# Patient Record
Sex: Female | Born: 1950 | Race: White | Hispanic: No | Smoking: Former smoker
Health system: Southern US, Community
[De-identification: ages and names within clinical notes are randomized; demographics above are authoritative.]

## PROBLEM LIST (undated history)

## (undated) DIAGNOSIS — T7840XA Allergy, unspecified, initial encounter: Secondary | ICD-10-CM

## (undated) DIAGNOSIS — H6121 Impacted cerumen, right ear: Secondary | ICD-10-CM

## (undated) DIAGNOSIS — F411 Generalized anxiety disorder: Secondary | ICD-10-CM

## (undated) DIAGNOSIS — J45909 Unspecified asthma, uncomplicated: Secondary | ICD-10-CM

## (undated) DIAGNOSIS — I10 Essential (primary) hypertension: Secondary | ICD-10-CM

## (undated) HISTORY — DX: Essential (primary) hypertension: I10

## (undated) HISTORY — DX: Impacted cerumen, right ear: H61.21

## (undated) HISTORY — PX: TONSILLECTOMY: SUR1361

## (undated) HISTORY — DX: Allergy, unspecified, initial encounter: T78.40XA

## (undated) HISTORY — DX: Generalized anxiety disorder: F41.1

---

## 1998-07-09 HISTORY — PX: CHOLECYSTECTOMY: SHX55

## 1998-07-14 ENCOUNTER — Encounter: Payer: Self-pay | Admitting: Internal Medicine

## 1998-07-14 ENCOUNTER — Emergency Department (HOSPITAL_COMMUNITY): Admission: EM | Admit: 1998-07-14 | Discharge: 1998-07-14 | Payer: Self-pay | Admitting: Emergency Medicine

## 1998-07-14 ENCOUNTER — Encounter: Payer: Self-pay | Admitting: Emergency Medicine

## 2012-12-20 ENCOUNTER — Emergency Department (HOSPITAL_COMMUNITY): Payer: Self-pay

## 2012-12-20 ENCOUNTER — Encounter (HOSPITAL_COMMUNITY): Payer: Self-pay | Admitting: Emergency Medicine

## 2012-12-20 ENCOUNTER — Emergency Department (HOSPITAL_COMMUNITY)
Admission: EM | Admit: 2012-12-20 | Discharge: 2012-12-20 | Disposition: A | Discharge status: Home / Self Care | Payer: Self-pay | Attending: Emergency Medicine | Admitting: Emergency Medicine

## 2012-12-20 DIAGNOSIS — R112 Nausea with vomiting, unspecified: Secondary | ICD-10-CM | POA: Insufficient documentation

## 2012-12-20 DIAGNOSIS — F17200 Nicotine dependence, unspecified, uncomplicated: Secondary | ICD-10-CM | POA: Insufficient documentation

## 2012-12-20 DIAGNOSIS — J45909 Unspecified asthma, uncomplicated: Secondary | ICD-10-CM | POA: Insufficient documentation

## 2012-12-20 DIAGNOSIS — R059 Cough, unspecified: Secondary | ICD-10-CM | POA: Insufficient documentation

## 2012-12-20 DIAGNOSIS — R0602 Shortness of breath: Secondary | ICD-10-CM | POA: Insufficient documentation

## 2012-12-20 DIAGNOSIS — R05 Cough: Secondary | ICD-10-CM | POA: Insufficient documentation

## 2012-12-20 HISTORY — DX: Unspecified asthma, uncomplicated: J45.909

## 2012-12-20 LAB — URINALYSIS, ROUTINE W REFLEX MICROSCOPIC
Ketones, ur: NEGATIVE mg/dL
Nitrite: NEGATIVE
Protein, ur: NEGATIVE mg/dL
Urobilinogen, UA: 0.2 mg/dL (ref 0.0–1.0)
pH: 5.5 (ref 5.0–8.0)

## 2012-12-20 LAB — CBC WITH DIFFERENTIAL/PLATELET
Basophils Absolute: 0.1 10*3/uL (ref 0.0–0.1)
Basophils Relative: 1 % (ref 0–1)
Eosinophils Relative: 5 % (ref 0–5)
Lymphocytes Relative: 18 % (ref 12–46)
MCV: 84.5 fL (ref 78.0–100.0)
Neutro Abs: 8.5 10*3/uL — ABNORMAL HIGH (ref 1.7–7.7)
Platelets: 197 10*3/uL (ref 150–400)
RDW: 12.6 % (ref 11.5–15.5)
WBC: 12.2 10*3/uL — ABNORMAL HIGH (ref 4.0–10.5)

## 2012-12-20 LAB — URINE MICROSCOPIC-ADD ON

## 2012-12-20 MED ORDER — PREDNISONE 20 MG PO TABS: 40.0000 mg | ORAL_TABLET | Freq: Every day | ORAL | Status: DC

## 2012-12-20 MED ORDER — IPRATROPIUM BROMIDE 0.02 % IN SOLN
0.5000 mg | Freq: Once | RESPIRATORY_TRACT | Status: AC
  Administered 2012-12-20: 0.5 mg via RESPIRATORY_TRACT

## 2012-12-20 MED ORDER — PREDNISONE 20 MG PO TABS
60.0000 mg | ORAL_TABLET | Freq: Once | ORAL | Status: AC
  Administered 2012-12-20: 60 mg via ORAL
  Filled 2012-12-20: qty 3

## 2012-12-20 MED ORDER — ALBUTEROL SULFATE HFA 108 (90 BASE) MCG/ACT IN AERS: 1.0000 | INHALATION_SPRAY | Freq: Four times a day (QID) | RESPIRATORY_TRACT | Status: DC | PRN

## 2012-12-20 MED ORDER — HYDROCOD POLST-CHLORPHEN POLST 10-8 MG/5ML PO LQCR: 5.0000 mL | Freq: Every evening | ORAL | Status: DC | PRN

## 2012-12-20 MED ORDER — ALBUTEROL SULFATE (5 MG/ML) 0.5% IN NEBU
2.5000 mg | INHALATION_SOLUTION | Freq: Once | RESPIRATORY_TRACT | Status: AC
  Administered 2012-12-20: 2.5 mg via RESPIRATORY_TRACT

## 2012-12-20 NOTE — ED Provider Notes (Signed)
History     CSN: [id number]  Arrival date & time 12/20/12  1323   First MD Initiated Contact with Patient 12/20/12 1346      Chief Complaint  Patient presents with  . Shortness of Breath  . Cough  . Nausea    (Consider location/radiation/quality/duration/timing/severity/associated sxs/prior treatment) HPI Comments: Pt states that she has had cough with sob and decreased appetite, pt states that he is vomiting from coughing so much for the last 4 days:pt states that she has been using a friends inhaler for the last couple of days:pt denies fever:pt states that she doesn't have any medical problems but she also hasn't seen a doctor in 7 years:pt states that she is a smoker:pt states that she has also take benadryl without relief:pt states that has allergy problems like this every year but he is more tired with it this time  The history is provided by the patient. No language interpreter was used.    Past Medical History  Diagnosis Date  . Asthma     Past Surgical History  Procedure Laterality Date  . Cholecystectomy    . Tonsillectomy      No family history on file.  History  Substance Use Topics  . Smoking status: Current Every Day Smoker -- 0.50 packs/day    Types: Cigarettes  . Smokeless tobacco: Not on file  . Alcohol Use: No    OB History   Grav Para Term Preterm Abortions TAB SAB Ect Mult Living                  Review of Systems  Constitutional: Negative.   Respiratory: Positive for shortness of breath.   Cardiovascular: Negative.  Negative for chest pain.  Gastrointestinal: Positive for vomiting.    Allergies  Review of patient's allergies indicates no known allergies.  Home Medications  No current outpatient prescriptions on file.  BP 173/102  Pulse 104  Temp(Src) 98.5 F (36.9 C) (Oral)  SpO2 91%  Physical Exam  Nursing note and vitals reviewed. Constitutional: She is oriented to person, place, and time. She appears well-developed and  well-nourished.  HENT:  Head: Normocephalic and atraumatic.  Eyes: Conjunctivae and EOM are normal.  Neck: Neck supple.  Cardiovascular: Normal rate and regular rhythm.   Pulmonary/Chest:  Rales and wheezing in the right lower lobe  Abdominal: Soft. Bowel sounds are normal. There is no tenderness.  Musculoskeletal: Normal range of motion.  Neurological: She is alert and oriented to person, place, and time.  Skin: Skin is warm and dry.  Psychiatric: She has a normal mood and affect.    ED Course  Procedures (including critical care time)  Labs Reviewed  CBC WITH DIFFERENTIAL - Abnormal; Notable for the following:    WBC 12.2 (*)    RBC 5.29 (*)    Hemoglobin 16.3 (*)    MCHC 36.5 (*)    Neutro Abs 8.5 (*)    All other components within normal limits  URINALYSIS, ROUTINE W REFLEX MICROSCOPIC - Abnormal; Notable for the following:    Leukocytes, UA TRACE (*)    All other components within normal limits  D-DIMER, QUANTITATIVE  URINE MICROSCOPIC-ADD ON   Dg Chest 2 View  12/20/2012   *RADIOLOGY REPORT*  Clinical Data: Cough, shortness of breath  CHEST - 2 VIEW  Comparison: None.  Findings: Cardiomediastinal silhouette is unremarkable.  No acute infiltrate or pleural effusion.  No pulmonary edema.  Bony thorax is unremarkable.  IMPRESSION: No active disease.  Original Report Authenticated By: Natasha Mead, M.D.     1. SOB (shortness of breath)       MDM  Likely a copd exacerbation:pt refusing to be admitted:discussed with pt and pt is willing to follow up with her husbands doctor at [facility]:this is likely pts baseline:pt is instructed to return as needed:pt low risk for pe and d-dimer is negative        Teressa Lower, NP 12/20/12 1728

## 2012-12-20 NOTE — ED Notes (Signed)
Pt from home reports SOB, N/V, productive clear sputum with cough, difficulty eating x 4 days. Pt A&Ox4 and in NAD. Pt has hx of asthma has used albuterol with relief.

## 2012-12-20 NOTE — ED Notes (Addendum)
Patient reports that since October she has been under a lot of stress r/t her husband and that since this she has been feeling "awful," without being able to pinpoint a better description for her feeling. Patient reports that she hasn't been able to eat for a few days. Reports nausea without vomiting and coughing up a clear with a slight tan tint. Patient reports SOB starting a week ago which got better and then worsened. Patient reports that she came to the ED today because she was not getting any better.

## 2012-12-21 NOTE — ED Provider Notes (Signed)
Medical screening examination/treatment/procedure(s) were performed by non-physician practitioner and as supervising physician I was immediately available for consultation/collaboration.  Renia Mikelson T Starlet Gallentine, MD 12/21/12 1537 

## 2017-05-02 DIAGNOSIS — Z23 Encounter for immunization: Secondary | ICD-10-CM | POA: Diagnosis not present

## 2017-12-25 ENCOUNTER — Encounter: Payer: Self-pay | Admitting: Primary Care

## 2017-12-25 ENCOUNTER — Encounter (INDEPENDENT_AMBULATORY_CARE_PROVIDER_SITE_OTHER): Payer: Self-pay

## 2017-12-25 ENCOUNTER — Ambulatory Visit (INDEPENDENT_AMBULATORY_CARE_PROVIDER_SITE_OTHER): Payer: Medicare Other | Admitting: Primary Care

## 2017-12-25 VITALS — BP 194/110 | HR 91 | Temp 98.3°F | Ht 67.5 in | Wt 194.2 lb

## 2017-12-25 DIAGNOSIS — F411 Generalized anxiety disorder: Secondary | ICD-10-CM | POA: Diagnosis not present

## 2017-12-25 DIAGNOSIS — I10 Essential (primary) hypertension: Secondary | ICD-10-CM

## 2017-12-25 DIAGNOSIS — J45909 Unspecified asthma, uncomplicated: Secondary | ICD-10-CM | POA: Insufficient documentation

## 2017-12-25 DIAGNOSIS — J4520 Mild intermittent asthma, uncomplicated: Secondary | ICD-10-CM

## 2017-12-25 LAB — LIPID PANEL
CHOLESTEROL: 160 mg/dL (ref 0–200)
HDL: 58.3 mg/dL (ref >39.00)
LDL CALC: 89 mg/dL (ref 0–99)
NONHDL: 102
Total CHOL/HDL Ratio: 3
Triglycerides: 63 mg/dL (ref 0.0–149.0)
VLDL: 12.6 mg/dL (ref 0.0–40.0)

## 2017-12-25 LAB — COMPREHENSIVE METABOLIC PANEL
ALK PHOS: 95 U/L (ref 39–117)
ALT: 12 U/L (ref 0–35)
AST: 13 U/L (ref 0–37)
Albumin: 4.4 g/dL (ref 3.5–5.2)
BUN: 9 mg/dL (ref 6–23)
CHLORIDE: 97 meq/L (ref 96–112)
CO2: 31 mEq/L (ref 19–32)
Calcium: 10 mg/dL (ref 8.4–10.5)
Creatinine, Ser: 0.85 mg/dL (ref 0.40–1.20)
GFR: 70.97 mL/min (ref >60.00)
GLUCOSE: 120 mg/dL — AB (ref 70–99)
POTASSIUM: 5.2 meq/L — AB (ref 3.5–5.1)
SODIUM: 134 meq/L — AB (ref 135–145)
TOTAL PROTEIN: 8.2 g/dL (ref 6.0–8.3)
Total Bilirubin: 1 mg/dL (ref 0.2–1.2)

## 2017-12-25 MED ORDER — LISINOPRIL 20 MG PO TABS: ORAL_TABLET | ORAL | 0 refills | Status: DC

## 2017-12-25 NOTE — Assessment & Plan Note (Signed)
Long history of elevated readings, two documented elevated office readings as well as home readings. Rx for lisinopril 20 mg sent to pharmacy. Wouldn't start too high given level of BP readings.   Check lipids and CMP today.Will have her monitor BP at home, follow up in 3 weeks for re-evaluation and BMP.

## 2017-12-25 NOTE — Progress Notes (Signed)
Subjective:    Patient ID: Jennifer Cannon, female    DOB: 06/09/1951, 67 y.o.   MRN: [id number]  HPI  Jennifer Cannon is a 67 year old female who presents today to establish care and discuss the problems mentioned below. Will obtain old records.  1) Essential Hypertension: Long history of elevated readings when going to "doctor's office's". She's checking her BP at home infrequently and gets readings 160's/80's-90's. She has strong family history of hypertension in both parents and grandparents.   She denies chest pain, dizziness, headaches.   BP Readings from Last 3 Encounters:  12/25/17 (!) 200/120  12/20/12 166/88   2) GAD: Diagnosed several years ago when she lived in [place]. She lost her husband 5 months ago and has felt an increase in symptoms. Symptoms include feeling anxious, feeling nervous, palpitations, daily worry. GAD 7 score of 10 today. She doesn't feel like she needs medication for treatment, she does see a therapist through [facility].   3) Asthma: Mild, during seasonal changes. Using albuterol inhaler sparingly. Quit smoking Christmas Eve 2018.  Review of Systems  Constitutional: Negative for fatigue.  Eyes: Negative for visual disturbance.  Respiratory: Negative for shortness of breath and wheezing.   Cardiovascular: Negative for chest pain.  Allergic/Immunologic: Positive for environmental allergies.  Neurological: Negative for dizziness and weakness.  Psychiatric/Behavioral:       See HPI       Past Medical History:  Diagnosis Date  . Asthma   . Essential hypertension   . GAD (generalized anxiety disorder)      Social History   Socioeconomic History  . Marital status: Married    Spouse name: Not on file  . Number of children: Not on file  . Years of education: Not on file  . Highest education level: Not on file  Occupational History  . Not on file  Social Needs  . Financial resource strain: Not on file  . Food insecurity:    Worry: Not  on file    Inability: Not on file  . Transportation needs:    Medical: Not on file    Non-medical: Not on file  Tobacco Use  . Smoking status: Former Smoker    Packs/day: 0.50    Types: Cigarettes  . Smokeless tobacco: Never Used  Substance and Sexual Activity  . Alcohol use: No  . Drug use: No  . Sexual activity: Not Currently  Lifestyle  . Physical activity:    Days per week: Not on file    Minutes per session: Not on file  . Stress: Not on file  Relationships  . Social connections:    Talks on phone: Not on file    Gets together: Not on file    Attends religious service: Not on file    Active member of club or organization: Not on file    Attends meetings of clubs or organizations: Not on file    Relationship status: Not on file  . Intimate partner violence:    Fear of current or ex partner: Not on file    Emotionally abused: Not on file    Physically abused: Not on file    Forced sexual activity: Not on file  Other Topics Concern  . Not on file  Social History Narrative   Widowed.   Retired, once worked in Physiological scientistoffice administrator.   Enjoys crocheting, making cards.      Past Surgical History:  Procedure Laterality Date  . CHOLECYSTECTOMY  2000  .  TONSILLECTOMY      Family History  Problem Relation Age of Onset  . Hypertension Mother   . Diabetes Mother   . Hypertension Father   . Stroke Father   . COPD Sister   . Cancer Sister   . Hypertension Sister   . Hypertension Sister     No Known Allergies  Current Outpatient Medications on File Prior to Visit  Medication Sig Dispense Refill  . APPLE CIDER VINEGAR PO Take by mouth.    . diphenhydrAMINE (BENADRYL) 25 MG tablet Take 25 mg by mouth 2 (two) times daily.     . Magnesium 500 MG TABS Take by mouth.    . Melatonin 5 MG TABS Take by mouth.    . Multiple Vitamins-Minerals (CENTRUM SILVER 50+WOMEN) TABS Take by mouth.    Marland Kitchen albuterol (PROVENTIL HFA;VENTOLIN HFA) 108 (90 BASE) MCG/ACT inhaler Inhale 1-2  puffs into the lungs every 6 (six) hours as needed for wheezing. (Patient not taking: Reported on 12/25/2017) 1 Inhaler 0   No current facility-administered medications on file prior to visit.     BP (!) 194/110   Pulse 91   Temp 98.3 F (36.8 C) (Oral)   Ht 5' 7.5" (1.715 m)   Wt 194 lb 4 oz (88.1 kg)   SpO2 96%   BMI 29.97 kg/m    Objective:   Physical Exam  Constitutional: She appears well-nourished.  Neck: Neck supple.  Cardiovascular: Normal rate and regular rhythm.  Respiratory: Effort normal and breath sounds normal.  Skin: Skin is warm and dry.  Psychiatric: She has a normal mood and affect.           Assessment & Plan:

## 2017-12-25 NOTE — Assessment & Plan Note (Signed)
Long history of anxiety, increased since husbands passing. Denies depression. GAD 7 score of 10. Overall she's doing well with therapy. Discussed to notify if symptoms progressed. Consider Zoloft 25 mg in the future if needed.

## 2017-12-25 NOTE — Patient Instructions (Signed)
Stop by the lab prior to leaving today. I will notify you of your results once received.   Start lisinopril 20 mg tablets for high blood pressure. Take 1 tablet by mouth once daily.  Start monitoring your blood pressure daily, around the same time of day, for the next several weeks.  Ensure that you have rested for 30 minutes prior to checking your blood pressure. Record your readings and bring them to your next visit.  Schedule a follow up visit in 3 weeks for blood pressure check.  It was a pleasure to meet you today! Please don't hesitate to call or message me with any questions. Welcome to [facility]!    DASH Eating Plan DASH stands for "Dietary Approaches to Stop Hypertension." The DASH eating plan is a healthy eating plan that has been shown to reduce high blood pressure (hypertension). It may also reduce your risk for type 2 diabetes, heart disease, and stroke. The DASH eating plan may also help with weight loss. What are tips for following this plan? General guidelines  Avoid eating more than 2,300 mg (milligrams) of salt (sodium) a day. If you have hypertension, you may need to reduce your sodium intake to 1,500 mg a day.  Limit alcohol intake to no more than 1 drink a day for nonpregnant women and 2 drinks a day for men. One drink equals 12 oz of beer, 5 oz of wine, or 1 oz of hard liquor.  Work with your health care provider to maintain a healthy body weight or to lose weight. Ask what an ideal weight is for you.  Get at least 30 minutes of exercise that causes your heart to beat faster (aerobic exercise) most days of the week. Activities may include walking, swimming, or biking.  Work with your health care provider or diet and nutrition specialist (dietitian) to adjust your eating plan to your individual calorie needs. Reading food labels  Check food labels for the amount of sodium per serving. Choose foods with less than 5 percent of the Daily Value of sodium. Generally,  foods with less than 300 mg of sodium per serving fit into this eating plan.  To find whole grains, look for the word "whole" as the first word in the ingredient list. Shopping  Buy products labeled as "low-sodium" or "no salt added."  Buy fresh foods. Avoid canned foods and premade or frozen meals. Cooking  Avoid adding salt when cooking. Use salt-free seasonings or herbs instead of table salt or sea salt. Check with your health care provider or pharmacist before using salt substitutes.  Do not fry foods. Cook foods using healthy methods such as baking, boiling, grilling, and broiling instead.  Cook with heart-healthy oils, such as olive, canola, soybean, or sunflower oil. Meal planning   Eat a balanced diet that includes: ? 5 or more servings of fruits and vegetables each day. At each meal, try to fill half of your plate with fruits and vegetables. ? Up to 6-8 servings of whole grains each day. ? Less than 6 oz of lean meat, poultry, or fish each day. A 3-oz serving of meat is about the same size as a deck of cards. One egg equals 1 oz. ? 2 servings of low-fat dairy each day. ? A serving of nuts, seeds, or beans 5 times each week. ? Heart-healthy fats. Healthy fats called Omega-3 fatty acids are found in foods such as flaxseeds and coldwater fish, like sardines, salmon, and mackerel.  Limit how much you  eat of the following: ? Canned or prepackaged foods. ? Food that is high in trans fat, such as fried foods. ? Food that is high in saturated fat, such as fatty meat. ? Sweets, desserts, sugary drinks, and other foods with added sugar. ? Full-fat dairy products.  Do not salt foods before eating.  Try to eat at least 2 vegetarian meals each week.  Eat more home-cooked food and less restaurant, buffet, and fast food.  When eating at a restaurant, ask that your food be prepared with less salt or no salt, if possible. What foods are recommended? The items listed may not be a  complete list. Talk with your dietitian about what dietary choices are best for you. Grains Whole-grain or whole-wheat bread. Whole-grain or whole-wheat pasta. Brown rice. [place]row. Bulgur. Whole-grain and low-sodium cereals. Pita bread. Low-fat, low-sodium crackers. Whole-wheat flour tortillas. Vegetables Fresh or frozen vegetables (raw, steamed, roasted, or grilled). Low-sodium or reduced-sodium tomato and vegetable juice. Low-sodium or reduced-sodium tomato sauce and tomato paste. Low-sodium or reduced-sodium canned vegetables. Fruits All fresh, dried, or frozen fruit. Canned fruit in natural juice (without added sugar). Meat and other protein foods Skinless chicken or [place]. Ground chicken or [place]. Pork with fat trimmed off. Fish and seafood. Egg whites. Dried beans, peas, or lentils. Unsalted nuts, nut butters, and seeds. Unsalted canned beans. Lean cuts of beef with fat trimmed off. Low-sodium, lean deli meat. Dairy Low-fat (1%) or fat-free (skim) milk. Fat-free, low-fat, or reduced-fat cheeses. Nonfat, low-sodium ricotta or cottage cheese. Low-fat or nonfat yogurt. Low-fat, low-sodium cheese. Fats and oils Soft margarine without trans fats. Vegetable oil. Low-fat, reduced-fat, or light mayonnaise and salad dressings (reduced-sodium). Canola, safflower, olive, soybean, and sunflower oils. Avocado. Seasoning and other foods Herbs. Spices. Seasoning mixes without salt. Unsalted popcorn and pretzels. Fat-free sweets. What foods are not recommended? The items listed may not be a complete list. Talk with your dietitian about what dietary choices are best for you. Grains Baked goods made with fat, such as croissants, muffins, or some breads. Dry pasta or rice meal packs. Vegetables Creamed or fried vegetables. Vegetables in a cheese sauce. Regular canned vegetables (not low-sodium or reduced-sodium). Regular canned tomato sauce and paste (not low-sodium or reduced-sodium). Regular  tomato and vegetable juice (not low-sodium or reduced-sodium). Angie Fava. Olives. Fruits Canned fruit in a light or heavy syrup. Fried fruit. Fruit in cream or butter sauce. Meat and other protein foods Fatty cuts of meat. Ribs. Fried meat. Berniece Salines. Sausage. Bologna and other processed lunch meats. Salami. Fatback. Hotdogs. Bratwurst. Salted nuts and seeds. Canned beans with added salt. Canned or smoked fish. Whole eggs or egg yolks. Chicken or [place] with skin. Dairy Whole or 2% milk, cream, and half-and-half. Whole or full-fat cream cheese. Whole-fat or sweetened yogurt. Full-fat cheese. Nondairy creamers. Whipped toppings. Processed cheese and cheese spreads. Fats and oils Butter. Stick margarine. Lard. Shortening. Ghee. Bacon fat. Tropical oils, such as coconut, palm kernel, or palm oil. Seasoning and other foods Salted popcorn and pretzels. Onion salt, garlic salt, seasoned salt, table salt, and sea salt. Worcestershire sauce. Tartar sauce. Barbecue sauce. Teriyaki sauce. Soy sauce, including reduced-sodium. Steak sauce. Canned and packaged gravies. Fish sauce. Oyster sauce. Cocktail sauce. Horseradish that you find on the shelf. Ketchup. Mustard. Meat flavorings and tenderizers. Bouillon cubes. Hot sauce and Tabasco sauce. Premade or packaged marinades. Premade or packaged taco seasonings. Relishes. Regular salad dressings. Where to find more information:  National Heart, Lung, and Blood Institute: https://wilson-eaton.com/  American  Heart Association: www.heart.org Summary  The DASH eating plan is a healthy eating plan that has been shown to reduce high blood pressure (hypertension). It may also reduce your risk for type 2 diabetes, heart disease, and stroke.  With the DASH eating plan, you should limit salt (sodium) intake to 2,300 mg a day. If you have hypertension, you may need to reduce your sodium intake to 1,500 mg a day.  When on the DASH eating plan, aim to eat more fresh fruits and  vegetables, whole grains, lean proteins, low-fat dairy, and heart-healthy fats.  Work with your health care provider or diet and nutrition specialist (dietitian) to adjust your eating plan to your individual calorie needs. This information is not intended to replace advice given to you by your health care provider. Make sure you discuss any questions you have with your health care provider. Document Released: 06/14/2011 Document Revised: 06/18/2016 Document Reviewed: 06/18/2016 Elsevier Interactive Patient Education  [employer].

## 2017-12-25 NOTE — Assessment & Plan Note (Signed)
Intermittent during seasonal changes. Infrequent use of albuterol outside of seasonal changes. No wheezing on exam.

## 2017-12-26 ENCOUNTER — Ambulatory Visit: Payer: Self-pay | Admitting: Internal Medicine

## 2017-12-27 ENCOUNTER — Encounter: Payer: Self-pay | Admitting: Primary Care

## 2017-12-30 ENCOUNTER — Other Ambulatory Visit: Payer: Self-pay | Admitting: Primary Care

## 2017-12-30 ENCOUNTER — Other Ambulatory Visit (INDEPENDENT_AMBULATORY_CARE_PROVIDER_SITE_OTHER): Payer: Medicare Other

## 2017-12-30 DIAGNOSIS — R7303 Prediabetes: Secondary | ICD-10-CM

## 2017-12-30 DIAGNOSIS — I10 Essential (primary) hypertension: Secondary | ICD-10-CM

## 2017-12-30 DIAGNOSIS — Z0000 Encounter for general adult medical examination without abnormal findings: Secondary | ICD-10-CM | POA: Diagnosis not present

## 2017-12-30 LAB — COMPREHENSIVE METABOLIC PANEL
ALBUMIN: 4.4 g/dL (ref 3.5–5.2)
ALK PHOS: 86 U/L (ref 39–117)
ALT: 14 U/L (ref 0–35)
AST: 16 U/L (ref 0–37)
BILIRUBIN TOTAL: 0.5 mg/dL (ref 0.2–1.2)
BUN: 6 mg/dL (ref 6–23)
CO2: 31 mEq/L (ref 19–32)
CREATININE: 0.82 mg/dL (ref 0.40–1.20)
Calcium: 9.9 mg/dL (ref 8.4–10.5)
Chloride: 96 mEq/L (ref 96–112)
GFR: 73.97 mL/min (ref >60.00)
Glucose, Bld: 114 mg/dL — ABNORMAL HIGH (ref 70–99)
Potassium: 5.7 mEq/L — ABNORMAL HIGH (ref 3.5–5.1)
SODIUM: 133 meq/L — AB (ref 135–145)
Total Protein: 8 g/dL (ref 6.0–8.3)

## 2017-12-30 LAB — HEMOGLOBIN A1C: Hgb A1c MFr Bld: 6.1 % (ref 4.6–6.5)

## 2017-12-31 ENCOUNTER — Other Ambulatory Visit: Payer: Self-pay | Admitting: Primary Care

## 2017-12-31 DIAGNOSIS — I10 Essential (primary) hypertension: Secondary | ICD-10-CM

## 2017-12-31 MED ORDER — AMLODIPINE BESYLATE 10 MG PO TABS: ORAL_TABLET | ORAL | 0 refills | Status: DC

## 2018-01-20 ENCOUNTER — Encounter: Payer: Self-pay | Admitting: Primary Care

## 2018-01-20 ENCOUNTER — Ambulatory Visit (INDEPENDENT_AMBULATORY_CARE_PROVIDER_SITE_OTHER): Payer: Medicare Other | Admitting: Primary Care

## 2018-01-20 DIAGNOSIS — I10 Essential (primary) hypertension: Secondary | ICD-10-CM

## 2018-01-20 MED ORDER — HYDROCHLOROTHIAZIDE 25 MG PO TABS: ORAL_TABLET | ORAL | 0 refills | Status: DC

## 2018-01-20 MED ORDER — AMLODIPINE BESYLATE 10 MG PO TABS: ORAL_TABLET | ORAL | 3 refills | Status: DC

## 2018-01-20 NOTE — Patient Instructions (Signed)
Continue taking Amlodipine 10 mg tablets for blood pressure.   Start hydrochlorothiazide 25 mg tablets for blood pressure. Take 1 tablet by mouth once daily.  Continue to work on [employer].  Start exercising. You should be getting 150 minutes of moderate intensity exercise weekly.  Continue to monitor your blood pressure as discussed.  Schedule a follow up visit in 3 weeks for re-evaluation.  It was a pleasure to see you today!   DASH Eating Plan DASH stands for "Dietary Approaches to Stop Hypertension." The DASH eating plan is a healthy eating plan that has been shown to reduce high blood pressure (hypertension). It may also reduce your risk for type 2 diabetes, heart disease, and stroke. The DASH eating plan may also help with weight loss. What are tips for following this plan? General guidelines  Avoid eating more than 2,300 mg (milligrams) of salt (sodium) a day. If you have hypertension, you may need to reduce your sodium intake to 1,500 mg a day.  Limit alcohol intake to no more than 1 drink a day for nonpregnant women and 2 drinks a day for men. One drink equals 12 oz of beer, 5 oz of wine, or 1 oz of hard liquor.  Work with your health care provider to maintain a healthy body weight or to lose weight. Ask what an ideal weight is for you.  Get at least 30 minutes of exercise that causes your heart to beat faster (aerobic exercise) most days of the week. Activities may include walking, swimming, or biking.  Work with your health care provider or diet and nutrition specialist (dietitian) to adjust your eating plan to your individual calorie needs. Reading food labels  Check food labels for the amount of sodium per serving. Choose foods with less than 5 percent of the Daily Value of sodium. Generally, foods with less than 300 mg of sodium per serving fit into this eating plan.  To find whole grains, look for the word "whole" as the first word in the ingredient  list. Shopping  Buy products labeled as "low-sodium" or "no salt added."  Buy fresh foods. Avoid canned foods and premade or frozen meals. Cooking  Avoid adding salt when cooking. Use salt-free seasonings or herbs instead of table salt or sea salt. Check with your health care provider or pharmacist before using salt substitutes.  Do not fry foods. Cook foods using healthy methods such as baking, boiling, grilling, and broiling instead.  Cook with heart-healthy oils, such as olive, canola, soybean, or sunflower oil. Meal planning   Eat a balanced diet that includes: ? 5 or more servings of fruits and vegetables each day. At each meal, try to fill half of your plate with fruits and vegetables. ? Up to 6-8 servings of whole grains each day. ? Less than 6 oz of lean meat, poultry, or fish each day. A 3-oz serving of meat is about the same size as a deck of cards. One egg equals 1 oz. ? 2 servings of low-fat dairy each day. ? A serving of nuts, seeds, or beans 5 times each week. ? Heart-healthy fats. Healthy fats called Omega-3 fatty acids are found in foods such as flaxseeds and coldwater fish, like sardines, salmon, and mackerel.  Limit how much you eat of the following: ? Canned or prepackaged foods. ? Food that is high in trans fat, such as fried foods. ? Food that is high in saturated fat, such as fatty meat. ? Sweets, desserts, sugary drinks, and  other foods with added sugar. ? Full-fat dairy products.  Do not salt foods before eating.  Try to eat at least 2 vegetarian meals each week.  Eat more home-cooked food and less restaurant, buffet, and fast food.  When eating at a restaurant, ask that your food be prepared with less salt or no salt, if possible. What foods are recommended? The items listed may not be a complete list. Talk with your dietitian about what dietary choices are best for you. Grains Whole-grain or whole-wheat bread. Whole-grain or whole-wheat pasta. Brown  rice. [place]. Bulgur. Whole-grain and low-sodium cereals. Pita bread. Low-fat, low-sodium crackers. Whole-wheat flour tortillas. Vegetables Fresh or frozen vegetables (raw, steamed, roasted, or grilled). Low-sodium or reduced-sodium tomato and vegetable juice. Low-sodium or reduced-sodium tomato sauce and tomato paste. Low-sodium or reduced-sodium canned vegetables. Fruits All fresh, dried, or frozen fruit. Canned fruit in natural juice (without added sugar). Meat and other protein foods Skinless chicken or [place]. Ground chicken or [place]. Pork with fat trimmed off. Fish and seafood. Egg whites. Dried beans, peas, or lentils. Unsalted nuts, nut butters, and seeds. Unsalted canned beans. Lean cuts of beef with fat trimmed off. Low-sodium, lean deli meat. Dairy Low-fat (1%) or fat-free (skim) milk. Fat-free, low-fat, or reduced-fat cheeses. Nonfat, low-sodium ricotta or cottage cheese. Low-fat or nonfat yogurt. Low-fat, low-sodium cheese. Fats and oils Soft margarine without trans fats. Vegetable oil. Low-fat, reduced-fat, or light mayonnaise and salad dressings (reduced-sodium). Canola, safflower, olive, soybean, and sunflower oils. Avocado. Seasoning and other foods Herbs. Spices. Seasoning mixes without salt. Unsalted popcorn and pretzels. Fat-free sweets. What foods are not recommended? The items listed may not be a complete list. Talk with your dietitian about what dietary choices are best for you. Grains Baked goods made with fat, such as croissants, muffins, or some breads. Dry pasta or rice meal packs. Vegetables Creamed or fried vegetables. Vegetables in a cheese sauce. Regular canned vegetables (not low-sodium or reduced-sodium). Regular canned tomato sauce and paste (not low-sodium or reduced-sodium). Regular tomato and vegetable juice (not low-sodium or reduced-sodium). Angie Fava. Olives. Fruits Canned fruit in a light or heavy syrup. Fried fruit. Fruit in cream or butter  sauce. Meat and other protein foods Fatty cuts of meat. Ribs. Fried meat. Berniece Salines. Sausage. Bologna and other processed lunch meats. Salami. Fatback. Hotdogs. Bratwurst. Salted nuts and seeds. Canned beans with added salt. Canned or smoked fish. Whole eggs or egg yolks. Chicken or [place] with skin. Dairy Whole or 2% milk, cream, and half-and-half. Whole or full-fat cream cheese. Whole-fat or sweetened yogurt. Full-fat cheese. Nondairy creamers. Whipped toppings. Processed cheese and cheese spreads. Fats and oils Butter. Stick margarine. Lard. Shortening. Ghee. Bacon fat. Tropical oils, such as coconut, palm kernel, or palm oil. Seasoning and other foods Salted popcorn and pretzels. Onion salt, garlic salt, seasoned salt, table salt, and sea salt. Worcestershire sauce. Tartar sauce. Barbecue sauce. Teriyaki sauce. Soy sauce, including reduced-sodium. Steak sauce. Canned and packaged gravies. Fish sauce. Oyster sauce. Cocktail sauce. Horseradish that you find on the shelf. Ketchup. Mustard. Meat flavorings and tenderizers. Bouillon cubes. Hot sauce and Tabasco sauce. Premade or packaged marinades. Premade or packaged taco seasonings. Relishes. Regular salad dressings. Where to find more information:  National Heart, Lung, and [place]: https://wilson-eaton.com/  American Heart Association: www.heart.org Summary  The DASH eating plan is a healthy eating plan that has been shown to reduce high blood pressure (hypertension). It may also reduce your risk for type 2 diabetes, heart disease, and stroke.  With the DASH eating plan, you should limit salt (sodium) intake to 2,300 mg a day. If you have hypertension, you may need to reduce your sodium intake to 1,500 mg a day.  When on the DASH eating plan, aim to eat more fresh fruits and vegetables, whole grains, lean proteins, low-fat dairy, and heart-healthy fats.  Work with your health care provider or diet and nutrition specialist (dietitian) to adjust  your eating plan to your individual calorie needs. This information is not intended to replace advice given to you by your health care provider. Make sure you discuss any questions you have with your health care provider. Document Released: 06/14/2011 Document Revised: 06/18/2016 Document Reviewed: 06/18/2016 Elsevier Interactive Patient Education  [employer]2018 Elsevier Inc.

## 2018-01-20 NOTE — Assessment & Plan Note (Signed)
Improved on Amlodipine 10 mg, still above goal. Lisinopril caused cough which has since resolved. Continue Amlodipine, add on HCTZ 25 mg today.  Will have her continue to monitor BP and follow up in 3 weeks for re-evaluation and BMP.

## 2018-01-20 NOTE — Progress Notes (Signed)
Subjective:    Patient ID: Jennifer Cannon, female    DOB: Nov 13, 1950, 67 y.o.   MRN: [id number]  HPI  Jennifer Cannon is a 67 year old female who presents today for follow up of hypertension.  She was last evaluated in mid June 2019 with a long history of untreated hypertension with a reading of 200/120 in the clinic. She was treated with Lisinopril which caused a horrible cough so she was switched to Amlodipine 10 mg tablets.   BP Readings from Last 3 Encounters:  01/20/18 (!) 160/100  12/25/17 (!) 194/110  12/20/12 166/88   Since her last visit she's feeling better. She denies cough since coming off of Lisinopril. She's checked her BP at few times which are running 140-150/80's. She denies chest pain, dizziness, shortness of breath.   Review of Systems  Eyes: Negative for visual disturbance.  Respiratory: Negative for cough and shortness of breath.   Cardiovascular: Negative for chest pain and leg swelling.  Neurological: Negative for dizziness and headaches.       Past Medical History:  Diagnosis Date  . Asthma   . Essential hypertension   . GAD (generalized anxiety disorder)      Social History   Socioeconomic History  . Marital status: Widowed    Spouse name: Not on file  . Number of children: Not on file  . Years of education: Not on file  . Highest education level: Not on file  Occupational History  . Not on file  Social Needs  . Financial resource strain: Not on file  . Food insecurity:    Worry: Not on file    Inability: Not on file  . Transportation needs:    Medical: Not on file    Non-medical: Not on file  Tobacco Use  . Smoking status: Former Smoker    Packs/day: 0.50    Types: Cigarettes  . Smokeless tobacco: Never Used  Substance and Sexual Activity  . Alcohol use: No  . Drug use: No  . Sexual activity: Not Currently  Lifestyle  . Physical activity:    Days per week: Not on file    Minutes per session: Not on file  . Stress: Not on file    Relationships  . Social connections:    Talks on phone: Not on file    Gets together: Not on file    Attends religious service: Not on file    Active member of club or organization: Not on file    Attends meetings of clubs or organizations: Not on file    Relationship status: Not on file  . Intimate partner violence:    Fear of current or ex partner: Not on file    Emotionally abused: Not on file    Physically abused: Not on file    Forced sexual activity: Not on file  Other Topics Concern  . Not on file  Social History Narrative   Widowed.   Retired, once worked in Physiological scientist.   Enjoys crocheting, making cards.      Past Surgical History:  Procedure Laterality Date  . CHOLECYSTECTOMY  2000  . TONSILLECTOMY      Family History  Problem Relation Age of Onset  . Hypertension Mother   . Diabetes Mother   . Hypertension Father   . Stroke Father   . COPD Sister   . Cancer Sister   . Hypertension Sister   . Hypertension Sister     Allergies  Allergen Reactions  .  Lisinopril     Current Outpatient Medications on File Prior to Visit  Medication Sig Dispense Refill  . APPLE CIDER VINEGAR PO Take by mouth.    . diphenhydrAMINE (BENADRYL) 25 MG tablet Take 25 mg by mouth 2 (two) times daily.     . Melatonin 5 MG TABS Take by mouth.    . Multiple Vitamins-Minerals (CENTRUM SILVER 50+WOMEN) TABS Take by mouth.    Marland Kitchen. albuterol (PROVENTIL HFA;VENTOLIN HFA) 108 (90 BASE) MCG/ACT inhaler Inhale 1-2 puffs into the lungs every 6 (six) hours as needed for wheezing. (Patient not taking: Reported on 12/25/2017) 1 Inhaler 0   No current facility-administered medications on file prior to visit.     BP (!) 160/100   Pulse 89   Temp 97.9 F (36.6 C) (Oral)   Ht 5' 7.75" (1.721 m)   Wt 195 lb 8 oz (88.7 kg)   SpO2 97%   BMI 29.95 kg/m    Objective:   Physical Exam  Constitutional: She appears well-nourished.  Neck: Neck supple.  Cardiovascular: Normal rate and  regular rhythm.  Respiratory: Effort normal and breath sounds normal.  Skin: Skin is warm and dry.           Assessment & Plan:

## 2018-01-21 ENCOUNTER — Other Ambulatory Visit: Payer: Self-pay | Admitting: Primary Care

## 2018-01-21 DIAGNOSIS — I10 Essential (primary) hypertension: Secondary | ICD-10-CM

## 2018-02-11 ENCOUNTER — Encounter: Payer: Self-pay | Admitting: Primary Care

## 2018-02-11 ENCOUNTER — Other Ambulatory Visit: Payer: Self-pay | Admitting: Primary Care

## 2018-02-11 ENCOUNTER — Ambulatory Visit (INDEPENDENT_AMBULATORY_CARE_PROVIDER_SITE_OTHER): Payer: Medicare Other | Admitting: Primary Care

## 2018-02-11 DIAGNOSIS — I10 Essential (primary) hypertension: Secondary | ICD-10-CM

## 2018-02-11 LAB — BASIC METABOLIC PANEL
BUN: 7 mg/dL (ref 6–23)
CO2: 33 mEq/L — ABNORMAL HIGH (ref 19–32)
CREATININE: 0.83 mg/dL (ref 0.40–1.20)
Calcium: 10.4 mg/dL (ref 8.4–10.5)
Chloride: 88 mEq/L — ABNORMAL LOW (ref 96–112)
GFR: 72.92 mL/min (ref >60.00)
Glucose, Bld: 123 mg/dL — ABNORMAL HIGH (ref 70–99)
POTASSIUM: 4.2 meq/L (ref 3.5–5.1)
Sodium: 130 mEq/L — ABNORMAL LOW (ref 135–145)

## 2018-02-11 MED ORDER — HYDROCHLOROTHIAZIDE 25 MG PO TABS: ORAL_TABLET | ORAL | 1 refills | Status: DC

## 2018-02-11 NOTE — Patient Instructions (Signed)
Stop by the lab prior to leaving today. I will notify you of your results once received.   Continue taking hydrochlorothiazide 25 mg and Amlodipine 10 mg for blood pressure.  It was a pleasure to see you today!

## 2018-02-11 NOTE — Progress Notes (Signed)
Subjective:    Patient ID: Jennifer Cannon, female    DOB: 1951/03/24, 67 y.o.   MRN: [id number]  HPI  Jennifer Cannon is a 67 year old female who presents today for follow up of hypertension.  She is currently managed on HCTZ 25 mg and Amlodipine 10 mg. She was last evaluated 2 weeks ago, HCTZ 25 mg was added to regimen. Lisinopril was discontinued several weeks prior due to side effect of cough.   BP Readings from Last 3 Encounters:  02/11/18 140/84  01/20/18 (!) 160/100  12/25/17 (!) 194/110    Since her last visit she's checking her BP at home. Her readings are ranging 117-130's/70-80's. Overall she's feeling well, she's recently started exercising and is working on her diet. She denies headaches, chest pain, dizziness.   Review of Systems  Eyes: Negative for visual disturbance.  Respiratory: Negative for shortness of breath.   Cardiovascular: Negative for chest pain.  Neurological: Negative for dizziness and headaches.       Past Medical History:  Diagnosis Date  . Asthma   . Essential hypertension   . GAD (generalized anxiety disorder)      Social History   Socioeconomic History  . Marital status: Widowed    Spouse name: Not on file  . Number of children: Not on file  . Years of education: Not on file  . Highest education level: Not on file  Occupational History  . Not on file  Social Needs  . Financial resource strain: Not on file  . Food insecurity:    Worry: Not on file    Inability: Not on file  . Transportation needs:    Medical: Not on file    Non-medical: Not on file  Tobacco Use  . Smoking status: Former Smoker    Packs/day: 0.50    Types: Cigarettes  . Smokeless tobacco: Never Used  Substance and Sexual Activity  . Alcohol use: No  . Drug use: No  . Sexual activity: Not Currently  Lifestyle  . Physical activity:    Days per week: Not on file    Minutes per session: Not on file  . Stress: Not on file  Relationships  . Social connections:    Talks on phone: Not on file    Gets together: Not on file    Attends religious service: Not on file    Active member of club or organization: Not on file    Attends meetings of clubs or organizations: Not on file    Relationship status: Not on file  . Intimate partner violence:    Fear of current or ex partner: Not on file    Emotionally abused: Not on file    Physically abused: Not on file    Forced sexual activity: Not on file  Other Topics Concern  . Not on file  Social History Narrative   Widowed.   Retired, once worked in Physiological scientist.   Enjoys crocheting, making cards.      Past Surgical History:  Procedure Laterality Date  . CHOLECYSTECTOMY  2000  . TONSILLECTOMY      Family History  Problem Relation Age of Onset  . Hypertension Mother   . Diabetes Mother   . Hypertension Father   . Stroke Father   . COPD Sister   . Cancer Sister   . Hypertension Sister   . Hypertension Sister     Allergies  Allergen Reactions  . Lisinopril     Current Outpatient  Medications on File Prior to Visit  Medication Sig Dispense Refill  . amLODipine (NORVASC) 10 MG tablet Take 1 tablet by mouth once daily for blood pressure. 90 tablet 3  . APPLE CIDER VINEGAR PO Take by mouth.    . diphenhydrAMINE (BENADRYL) 25 MG tablet Take 25 mg by mouth 2 (two) times daily.     . Melatonin 5 MG TABS Take by mouth.    . Multiple Vitamins-Minerals (CENTRUM SILVER 50+WOMEN) TABS Take by mouth.    Marland Kitchen. albuterol (PROVENTIL HFA;VENTOLIN HFA) 108 (90 BASE) MCG/ACT inhaler Inhale 1-2 puffs into the lungs every 6 (six) hours as needed for wheezing. (Patient not taking: Reported on 12/25/2017) 1 Inhaler 0   No current facility-administered medications on file prior to visit.     BP 140/84   Pulse 79   Temp 97.9 F (36.6 C) (Oral)   Ht 5' 7.75" (1.721 m)   Wt 195 lb 12 oz (88.8 kg)   SpO2 97%   BMI 29.98 kg/m    Objective:   Physical Exam  Constitutional: She appears well-nourished.    Neck: Neck supple.  Cardiovascular: Normal rate and regular rhythm.  Respiratory: Effort normal and breath sounds normal.  Skin: Skin is warm and dry.           Assessment & Plan:

## 2018-02-11 NOTE — Assessment & Plan Note (Signed)
Improved on HCTZ 25 mg an Amlodipine 10 mg. Will continue same given that she is stable. Home readings are better. BMP pending today.

## 2018-02-16 ENCOUNTER — Other Ambulatory Visit: Payer: Self-pay | Admitting: Primary Care

## 2018-02-16 DIAGNOSIS — I10 Essential (primary) hypertension: Secondary | ICD-10-CM

## 2018-03-12 ENCOUNTER — Other Ambulatory Visit (INDEPENDENT_AMBULATORY_CARE_PROVIDER_SITE_OTHER): Payer: Medicare Other

## 2018-03-12 DIAGNOSIS — I10 Essential (primary) hypertension: Secondary | ICD-10-CM | POA: Diagnosis not present

## 2018-03-12 LAB — BASIC METABOLIC PANEL
BUN: 6 mg/dL (ref 6–23)
CHLORIDE: 92 meq/L — AB (ref 96–112)
CO2: 31 mEq/L (ref 19–32)
Calcium: 9.9 mg/dL (ref 8.4–10.5)
Creatinine, Ser: 0.84 mg/dL (ref 0.40–1.20)
GFR: 71.9 mL/min (ref >60.00)
Glucose, Bld: 113 mg/dL — ABNORMAL HIGH (ref 70–99)
POTASSIUM: 4 meq/L (ref 3.5–5.1)
Sodium: 131 mEq/L — ABNORMAL LOW (ref 135–145)

## 2018-03-13 ENCOUNTER — Encounter: Payer: Self-pay | Admitting: Family Medicine

## 2018-03-13 ENCOUNTER — Ambulatory Visit (INDEPENDENT_AMBULATORY_CARE_PROVIDER_SITE_OTHER): Payer: Medicare Other | Admitting: Family Medicine

## 2018-03-13 DIAGNOSIS — H6121 Impacted cerumen, right ear: Secondary | ICD-10-CM | POA: Diagnosis not present

## 2018-03-13 DIAGNOSIS — I10 Essential (primary) hypertension: Secondary | ICD-10-CM | POA: Diagnosis not present

## 2018-03-13 HISTORY — DX: Impacted cerumen, right ear: H61.21

## 2018-03-13 NOTE — Progress Notes (Signed)
   Subjective:    Patient ID: Jennifer Cannon, female    DOB: Jul 08, 1951, 67 y.o.   MRN: [id number]  Otalgia   There is pain in the right ear. This is a new problem. The current episode started in the past 7 days (noted in last 4 days after using a Q tip.). The problem has been unchanged. There has been no fever. The pain is at a severity of 2/10. Pertinent negatives include no coughing, diarrhea, headaches, neck pain, rash, rhinorrhea, sore throat or vomiting. Treatments tried: has tried peroxide. The treatment provided no relief. There is no history of a chronic ear infection, hearing loss or a tympanostomy tube.    BP improved on amlodipine  New start..  Stopped HCTZ  2 days ago given dry mouth and didn't like frequent urination. BP Readings from Last 3 Encounters:  03/13/18 (!) 148/72  02/11/18 140/84  01/20/18 (!) 160/100     Blood pressure (!) 148/72, pulse 94, temperature 97.7 F (36.5 C), temperature source Oral, height 5' 7.75" (1.721 m), weight 198 lb 4 oz (89.9 kg), SpO2 97 %.   Review of Systems  HENT: Positive for ear pain. Negative for rhinorrhea and sore throat.   Respiratory: Negative for cough.   Gastrointestinal: Negative for diarrhea and vomiting.  Musculoskeletal: Negative for neck pain.  Skin: Negative for rash.  Neurological: Negative for headaches.       Objective:   Physical Exam  Constitutional: Vital signs are normal. She appears well-developed and well-nourished. She is cooperative.  Non-toxic appearance. She does not appear ill. No distress.  HENT:  Head: Normocephalic.  Right Ear: Hearing, tympanic membrane, external ear and ear canal normal. Tympanic membrane is not erythematous, not retracted and not bulging.  Left Ear: Hearing, tympanic membrane, external ear and ear canal normal. Tympanic membrane is not erythematous, not retracted and not bulging.  Nose: No mucosal edema or rhinorrhea. Right sinus exhibits no maxillary sinus tenderness and no  frontal sinus tenderness. Left sinus exhibits no maxillary sinus tenderness and no frontal sinus tenderness.  Mouth/Throat: Uvula is midline, oropharynx is clear and moist and mucous membranes are normal.   Right ear cerumen impaction cleared with irrigation  Eyes: Pupils are equal, round, and reactive to light. Conjunctivae, EOM and lids are normal. Lids are everted and swept, no foreign bodies found.  Neck: Trachea normal and normal range of motion. Neck supple. Carotid bruit is not present. No thyroid mass and no thyromegaly present.  Cardiovascular: Normal rate, regular rhythm, S1 normal, S2 normal, normal heart sounds, intact distal pulses and normal pulses. Exam reveals no gallop and no friction rub.  No murmur heard. Pulmonary/Chest: Effort normal and breath sounds normal. No tachypnea. No respiratory distress. She has no decreased breath sounds. She has no wheezes. She has no rhonchi. She has no rales.  Abdominal: Soft. Normal appearance and bowel sounds are normal. There is no tenderness.  Neurological: She is alert.  Skin: Skin is warm, dry and intact. No rash noted.  Psychiatric: Her speech is normal and behavior is normal. Judgment and thought content normal. Her mood appears not anxious. Cognition and memory are normal. She does not exhibit a depressed mood.          Assessment & Plan:

## 2018-03-13 NOTE — Assessment & Plan Note (Signed)
Irrigation performed without issue and prevention reviewed.

## 2018-03-13 NOTE — Patient Instructions (Addendum)
Follow blood pressure at home.. Goal < 140/90... Call PCP if persistently high off HCTZ.

## 2018-03-13 NOTE — Assessment & Plan Note (Signed)
Borderline BP off HCTZ  X 2 days ( stopped given SE). Noted NA improved only 1 point ( was before she stopped HCTZ). She will remain off HCTZ for now and I will send a note to PCP for recommendations on plan with ? Restart HCTZ, lower dose? Or new med? Or follow.

## 2018-03-24 ENCOUNTER — Other Ambulatory Visit: Payer: Self-pay | Admitting: Primary Care

## 2018-03-24 DIAGNOSIS — E871 Hypo-osmolality and hyponatremia: Secondary | ICD-10-CM

## 2018-04-04 ENCOUNTER — Other Ambulatory Visit (INDEPENDENT_AMBULATORY_CARE_PROVIDER_SITE_OTHER): Payer: Medicare Other

## 2018-04-04 DIAGNOSIS — E871 Hypo-osmolality and hyponatremia: Secondary | ICD-10-CM | POA: Diagnosis not present

## 2018-04-04 LAB — BASIC METABOLIC PANEL
BUN: 9 mg/dL (ref 6–23)
CHLORIDE: 102 meq/L (ref 96–112)
CO2: 28 meq/L (ref 19–32)
Calcium: 9.7 mg/dL (ref 8.4–10.5)
Creatinine, Ser: 0.98 mg/dL (ref 0.40–1.20)
GFR: 60.17 mL/min (ref >60.00)
Glucose, Bld: 127 mg/dL — ABNORMAL HIGH (ref 70–99)
POTASSIUM: 5.3 meq/L — AB (ref 3.5–5.1)
SODIUM: 138 meq/L (ref 135–145)

## 2018-04-06 ENCOUNTER — Other Ambulatory Visit: Payer: Self-pay | Admitting: Primary Care

## 2018-04-06 DIAGNOSIS — I10 Essential (primary) hypertension: Secondary | ICD-10-CM

## 2018-04-06 MED ORDER — HYDROCHLOROTHIAZIDE 12.5 MG PO TABS: ORAL_TABLET | ORAL | 0 refills | Status: DC

## 2018-04-11 ENCOUNTER — Ambulatory Visit: Payer: Self-pay | Admitting: *Deleted

## 2018-04-11 NOTE — Telephone Encounter (Signed)
Ms. Anspaugh notified as instructed by telephone.

## 2018-04-11 NOTE — Telephone Encounter (Signed)
Allayne Gitelman NP is not in office again until 04/15/18 and Pamala Hurry NP has left office. Please advise.

## 2018-04-11 NOTE — Telephone Encounter (Signed)
Reviewed  Her recent notes  Back and forth with her PCP as well as last several OVs.  It sounds like HCTZ  low dose is working as it should. BP at goal. Try taking with food to help with nausea, frequent urination with HCTZ usually gets better with time on HCTZ.  Try to sleep 8hr at night.  Okay to drink mineral/vitamin water, stay hydrated.  Follow up with PCP as planned.

## 2018-04-11 NOTE — Telephone Encounter (Signed)
Pt calling with complaints of feeling sick to the stomach, weak, tired and keeps going to the bathroom since starting HCTZ. Pt states she sent a MyChart message to Mayra Reel, NP on Oct 1st and states that the symptoms have gotten better but she is getting anxious because it seems as if the symptoms are lingering. Pt has not checked her BP today but states it was 138/83 last night. Rena,[facility] contacted for recommendations of PCP. Triage note will be forwarded for provider review.  Answer Assessment - Initial Assessment Questions 1. DESCRIPTION: "Describe how you are feeling."     Feels weak and tired 2. SEVERITY: "How bad is it?"  "Can you stand and walk?"   - MILD - Feels weak or tired, but does not interfere with work, school or normal activities   - MODERATE - Able to stand and walk; weakness interferes with work, school, or normal activities   - SEVERE - Unable to stand or walk     mild 3. ONSET:  "When did the weakness begin?"     Since Sunday or Monday 4. CAUSE: "What do you think is causing the weakness?"     Starting HCTZ 5. MEDICINES: "Have you recently started a new medicine or had a change in the amount of a medicine?"     HCTZ 6. OTHER SYMPTOMS: "Do you have any other symptoms?" (e.g., chest pain, fever, cough, SOB, vomiting, diarrhea, bleeding, other areas of pain)     Nauseated, weak and tired 7. PREGNANCY: "Is there any chance you are pregnant?" "When was your last menstrual period?"     n/a  Protocols used: WEAKNESS (GENERALIZED) AND FATIGUE-A-AH

## 2018-04-22 ENCOUNTER — Encounter: Payer: Self-pay | Admitting: Primary Care

## 2018-04-22 ENCOUNTER — Ambulatory Visit (INDEPENDENT_AMBULATORY_CARE_PROVIDER_SITE_OTHER): Payer: Medicare Other | Admitting: Primary Care

## 2018-04-22 VITALS — BP 148/94 | HR 93 | Temp 97.8°F | Ht 67.75 in | Wt 203.2 lb

## 2018-04-22 DIAGNOSIS — I10 Essential (primary) hypertension: Secondary | ICD-10-CM | POA: Diagnosis not present

## 2018-04-22 MED ORDER — METOPROLOL SUCCINATE ER 25 MG PO TB24: ORAL_TABLET | ORAL | 0 refills | Status: DC

## 2018-04-22 NOTE — Assessment & Plan Note (Signed)
Compliant to Amlodipine 10 mg, BP above goal today. Discontinued all HCTZ. Start low dose metoprolol succinate daily. Will have her continue to monitor BP, follow up in the office in 3 weeks. BMP next visit.

## 2018-04-22 NOTE — Progress Notes (Signed)
Subjective:    Patient ID: Jennifer Cannon, female    DOB: [birth year], 67 y.o.   MRN: [id number]  HPI  Jennifer Cannon is a 67 year old female who presents today for follow up of hypertension.  She is currently managed on Amlodipine 10 mg and HCTZ 12.5 mg. HCTZ was reduced down to 12.5 mg due to decrease in sodium. Her recent BMP without HCTZ showed correction in sodium but an increase in potassium.   BP Readings from Last 3 Encounters:  04/22/18 (!) 148/94  03/13/18 (!) 148/72  02/11/18 140/84   Since her last visit she's not taking HCTZ 12.5 mg as it caused her to feel sluggish. She is compliant to Amlodipine 10 mg. She's checking her BP at home which is running 130-140's/70's-80's. She continues to feel anxious regarding her blood pressure.   Review of Systems  Respiratory: Negative for shortness of breath.   Cardiovascular: Negative for chest pain and palpitations.  Neurological: Negative for dizziness and headaches.       Past Medical History:  Diagnosis Date  . Asthma   . Essential hypertension   . GAD (generalized anxiety disorder)      Social History   Socioeconomic History  . Marital status: Widowed    Spouse name: Not on file  . Number of children: Not on file  . Years of education: Not on file  . Highest education level: Not on file  Occupational History  . Not on file  Social Needs  . Financial resource strain: Not on file  . Food insecurity:    Worry: Not on file    Inability: Not on file  . Transportation needs:    Medical: Not on file    Non-medical: Not on file  Tobacco Use  . Smoking status: Former Smoker    Packs/day: 0.50    Types: Cigarettes  . Smokeless tobacco: Never Used  Substance and Sexual Activity  . Alcohol use: No  . Drug use: No  . Sexual activity: Not Currently  Lifestyle  . Physical activity:    Days per week: Not on file    Minutes per session: Not on file  . Stress: Not on file  Relationships  . Social connections:   Talks on phone: Not on file    Gets together: Not on file    Attends religious service: Not on file    Active member of club or organization: Not on file    Attends meetings of clubs or organizations: Not on file    Relationship status: Not on file  . Intimate partner violence:    Fear of current or ex partner: Not on file    Emotionally abused: Not on file    Physically abused: Not on file    Forced sexual activity: Not on file  Other Topics Concern  . Not on file  Social History Narrative   Widowed.   Retired, once worked in Physiological scientist.   Enjoys crocheting, making cards.      Past Surgical History:  Procedure Laterality Date  . CHOLECYSTECTOMY  2000  . TONSILLECTOMY      Family History  Problem Relation Age of Onset  . Hypertension Mother   . Diabetes Mother   . Hypertension Father   . Stroke Father   . COPD Sister   . Cancer Sister   . Hypertension Sister   . Hypertension Sister     Allergies  Allergen Reactions  . Lisinopril   . Hctz [  Hydrochlorothiazide] Other (See Comments)    Dry mouth    Current Outpatient Medications on File Prior to Visit  Medication Sig Dispense Refill  . albuterol (PROVENTIL HFA;VENTOLIN HFA) 108 (90 BASE) MCG/ACT inhaler Inhale 1-2 puffs into the lungs every 6 (six) hours as needed for wheezing. 1 Inhaler 0  . amLODipine (NORVASC) 10 MG tablet Take 1 tablet by mouth once daily for blood pressure. 90 tablet 3  . diphenhydrAMINE (BENADRYL) 25 MG tablet Take 25 mg by mouth 2 (two) times daily.     . Melatonin 5 MG TABS Take by mouth.     No current facility-administered medications on file prior to visit.     BP (!) 148/94   Pulse 93   Temp 97.8 F (36.6 C) (Oral)   Ht 5' 7.75" (1.721 m)   Wt 203 lb 4 oz (92.2 kg)   SpO2 98%   BMI 31.13 kg/m    Objective:   Physical Exam  Constitutional: She appears well-nourished.  Neck: Neck supple.  Cardiovascular: Normal rate and regular rhythm.  Respiratory: Effort normal  and breath sounds normal.  Skin: Skin is warm and dry.           Assessment & Plan:

## 2018-04-22 NOTE — Patient Instructions (Signed)
Continue amlodipine 10 mg daily.  Start metoprolol succinate 25 mg daily. Take 1 tablet by mouth once daily.  Continue to monitor your blood pressure daily.  Schedule a follow up visit in 3 weeks for blood pressure check.  It was a pleasure to see you today!

## 2018-05-14 ENCOUNTER — Other Ambulatory Visit: Payer: Self-pay | Admitting: Primary Care

## 2018-05-14 DIAGNOSIS — I10 Essential (primary) hypertension: Secondary | ICD-10-CM

## 2018-05-15 ENCOUNTER — Encounter: Payer: Self-pay | Admitting: Primary Care

## 2018-05-15 ENCOUNTER — Ambulatory Visit (INDEPENDENT_AMBULATORY_CARE_PROVIDER_SITE_OTHER): Payer: Medicare Other | Admitting: Primary Care

## 2018-05-15 DIAGNOSIS — I10 Essential (primary) hypertension: Secondary | ICD-10-CM

## 2018-05-15 MED ORDER — METOPROLOL SUCCINATE ER 25 MG PO TB24: ORAL_TABLET | ORAL | 2 refills | Status: DC

## 2018-05-15 NOTE — Assessment & Plan Note (Signed)
Improved on Amlodipine 10 mg and metoprolol succinate 25 mg. Continue same.   Recommended regular exercise, proper water intake, elevate extremities when resting as needed.

## 2018-05-15 NOTE — Progress Notes (Signed)
Subjective:    Patient ID: Jennifer Cannon, female    DOB: 1950-10-27, 67 y.o.   MRN: [id number]  HPI  Jennifer Cannon is a 67 year old female who presents today for follow up of hypertension.  She is currently managed on metoprolol succinate 25 mg and Amlodipine 10 mg. She was last evaluated on 04/22/18 with elevated blood pressure readings. She could not tolerate HCTZ 12.5 mg as it caused her to feel sluggish, compliant to Amlodipine 10 mg. Metoprolol succinate 25 mg was added to her regimen.   BP Readings from Last 3 Encounters:  05/15/18 136/84  04/22/18 (!) 148/94  03/13/18 (!) 148/72   Since her last visit she's checking her blood pressure at home and is getting readings of 120's-130's/70's-80's. Her sluggishness has improved overall. She denies headaches, dizziness. She is compliant to both metoprolol succinate 25 mg and Amlodipine 10 mg daily.   She has noticed some intermittent ankle edema after walking for prolonged periods of time. She has started to elevate her legs with improvement. She is not exercising and doesn't drink much water.   Review of Systems  Respiratory: Negative for shortness of breath.   Cardiovascular: Positive for leg swelling. Negative for chest pain.  Neurological: Negative for dizziness and headaches.       Past Medical History:  Diagnosis Date  . Asthma   . Essential hypertension   . GAD (generalized anxiety disorder)      Social History   Socioeconomic History  . Marital status: Widowed    Spouse name: Not on file  . Number of children: Not on file  . Years of education: Not on file  . Highest education level: Not on file  Occupational History  . Not on file  Social Needs  . Financial resource strain: Not on file  . Food insecurity:    Worry: Not on file    Inability: Not on file  . Transportation needs:    Medical: Not on file    Non-medical: Not on file  Tobacco Use  . Smoking status: Former Smoker    Packs/day: 0.50    Types:  Cigarettes  . Smokeless tobacco: Never Used  Substance and Sexual Activity  . Alcohol use: No  . Drug use: No  . Sexual activity: Not Currently  Lifestyle  . Physical activity:    Days per week: Not on file    Minutes per session: Not on file  . Stress: Not on file  Relationships  . Social connections:    Talks on phone: Not on file    Gets together: Not on file    Attends religious service: Not on file    Active member of club or organization: Not on file    Attends meetings of clubs or organizations: Not on file    Relationship status: Not on file  . Intimate partner violence:    Fear of current or ex partner: Not on file    Emotionally abused: Not on file    Physically abused: Not on file    Forced sexual activity: Not on file  Other Topics Concern  . Not on file  Social History Narrative   Widowed.   Retired, once worked in Physiological scientist.   Enjoys crocheting, making cards.      Past Surgical History:  Procedure Laterality Date  . CHOLECYSTECTOMY  2000  . TONSILLECTOMY      Family History  Problem Relation Age of Onset  . Hypertension Mother   .  Diabetes Mother   . Hypertension Father   . Stroke Father   . COPD Sister   . Cancer Sister   . Hypertension Sister   . Hypertension Sister     Allergies  Allergen Reactions  . Lisinopril   . Hctz [Hydrochlorothiazide] Other (See Comments)    Dry mouth    Current Outpatient Medications on File Prior to Visit  Medication Sig Dispense Refill  . albuterol (PROVENTIL HFA;VENTOLIN HFA) 108 (90 BASE) MCG/ACT inhaler Inhale 1-2 puffs into the lungs every 6 (six) hours as needed for wheezing. 1 Inhaler 0  . amLODipine (NORVASC) 10 MG tablet Take 1 tablet by mouth once daily for blood pressure. 90 tablet 3  . diphenhydrAMINE (BENADRYL) 25 MG tablet Take 25 mg by mouth 2 (two) times daily.     . Melatonin 5 MG TABS Take by mouth.     No current facility-administered medications on file prior to visit.     BP  136/84   Pulse 73   Temp 98.1 F (36.7 C) (Oral)   Ht 5' 7.75" (1.721 m)   Wt 209 lb 8 oz (95 kg)   SpO2 98%   BMI 32.09 kg/m    Objective:   Physical Exam  Constitutional: She appears well-nourished.  Neck: Neck supple.  Cardiovascular: Normal rate and regular rhythm.  No lower extremity edema noted  Respiratory: Effort normal and breath sounds normal.  Skin: Skin is warm and dry.           Assessment & Plan:

## 2018-05-15 NOTE — Patient Instructions (Addendum)
Continue taking Amlodipine 10 mg and metoprolol succinate 25 mg daily for blood pressure.  Ensure you are consuming 64 ounces of water daily.  Start exercising. You should be getting 150 minutes of moderate intensity exercise weekly.  Please schedule a physical with me and a Medicare Wellness Visit with our nurse in June 2020.   It was a pleasure to see you today!

## 2018-08-07 ENCOUNTER — Encounter: Payer: Self-pay | Admitting: Primary Care

## 2018-08-07 ENCOUNTER — Ambulatory Visit (INDEPENDENT_AMBULATORY_CARE_PROVIDER_SITE_OTHER): Payer: PPO | Admitting: Primary Care

## 2018-08-07 VITALS — BP 160/94 | HR 85 | Temp 98.2°F | Ht 67.75 in | Wt 219.8 lb

## 2018-08-07 DIAGNOSIS — I10 Essential (primary) hypertension: Secondary | ICD-10-CM

## 2018-08-07 MED ORDER — LOSARTAN POTASSIUM 50 MG PO TABS: 50.0000 mg | ORAL_TABLET | Freq: Every day | ORAL | 0 refills | Status: DC

## 2018-08-07 NOTE — Progress Notes (Signed)
Subjective:    Patient ID: Jennifer Cannon, female    DOB: 07-Apr-1951, 68 y.o.   MRN: [id number]  HPI  Ms. Berber is a 68 year old female with a history of hypertension who presents today for follow up.  She was last evaluated in early November 2019 for follow up. She was managed on metoprolol succinate 25 mg and amlodipine 10 mg. She was previously managed on HCTZ 12.5 mg but this made her feel "sluggish". She was initially placed on lisinopril but this caused a cough. She was noted to have hyperkalemia but was also taking oral potassium tablets for which she's not taken in months. During her last visit her BP was well controlled on this regimen.   She sent a message through the My Chart portal with reports of "side effects" to metoprolol succinate 25 mg. Symptoms included feeling tired, shortness of breath, body aches, weakness. This didn't start until metoprolol was initiated. She is still taking her metoprolol.   BP Readings from Last 3 Encounters:  08/07/18 (!) 160/94  05/15/18 136/84  04/22/18 (!) 148/94   She's been checking her BP at home infrequently which is running 140-160's/80's. She denies chest pain. She is not taking potassium supplements.   Review of Systems  Constitutional: Positive for fatigue. Negative for chills and fever.  HENT: Negative for congestion.   Respiratory: Positive for shortness of breath. Negative for cough.   Cardiovascular: Negative for chest pain.  Neurological: Positive for weakness. Negative for dizziness.       Past Medical History:  Diagnosis Date  . Asthma   . Essential hypertension   . GAD (generalized anxiety disorder)      Social History   Socioeconomic History  . Marital status: Widowed    Spouse name: Not on file  . Number of children: Not on file  . Years of education: Not on file  . Highest education level: Not on file  Occupational History  . Not on file  Social Needs  . Financial resource strain: Not on file  . Food  insecurity:    Worry: Not on file    Inability: Not on file  . Transportation needs:    Medical: Not on file    Non-medical: Not on file  Tobacco Use  . Smoking status: Former Smoker    Packs/day: 0.50    Types: Cigarettes  . Smokeless tobacco: Never Used  Substance and Sexual Activity  . Alcohol use: No  . Drug use: No  . Sexual activity: Not Currently  Lifestyle  . Physical activity:    Days per week: Not on file    Minutes per session: Not on file  . Stress: Not on file  Relationships  . Social connections:    Talks on phone: Not on file    Gets together: Not on file    Attends religious service: Not on file    Active member of club or organization: Not on file    Attends meetings of clubs or organizations: Not on file    Relationship status: Not on file  . Intimate partner violence:    Fear of current or ex partner: Not on file    Emotionally abused: Not on file    Physically abused: Not on file    Forced sexual activity: Not on file  Other Topics Concern  . Not on file  Social History Narrative   Widowed.   Retired, once worked in Physiological scientist.   Enjoys crocheting, making  cards.      Past Surgical History:  Procedure Laterality Date  . CHOLECYSTECTOMY  2000  . TONSILLECTOMY      Family History  Problem Relation Age of Onset  . Hypertension Mother   . Diabetes Mother   . Hypertension Father   . Stroke Father   . COPD Sister   . Cancer Sister   . Hypertension Sister   . Hypertension Sister     Allergies  Allergen Reactions  . Lisinopril   . Hctz [Hydrochlorothiazide] Other (See Comments)    Dry mouth    Current Outpatient Medications on File Prior to Visit  Medication Sig Dispense Refill  . albuterol (PROVENTIL HFA;VENTOLIN HFA) 108 (90 BASE) MCG/ACT inhaler Inhale 1-2 puffs into the lungs every 6 (six) hours as needed for wheezing. 1 Inhaler 0  . amLODipine (NORVASC) 10 MG tablet Take 1 tablet by mouth once daily for blood pressure. 90  tablet 3  . diphenhydrAMINE (BENADRYL) 25 MG tablet Take 25 mg by mouth 2 (two) times daily.     . Melatonin 5 MG TABS Take by mouth.     No current facility-administered medications on file prior to visit.     BP (!) 160/94   Pulse 85   Temp 98.2 F (36.8 C) (Oral)   Ht 5' 7.75" (1.721 m)   Wt 219 lb 12 oz (99.7 kg)   SpO2 97%   BMI 33.66 kg/m    Objective:   Physical Exam  Constitutional: She appears well-nourished.  Neck: Neck supple.  Cardiovascular: Normal rate and regular rhythm.  Respiratory: Effort normal and breath sounds normal.  Musculoskeletal:     Comments: 5/5 strength to bilateral lower extremities. Ambulates well in clinic.   Skin: Skin is warm and dry.           Assessment & Plan:

## 2018-08-07 NOTE — Assessment & Plan Note (Signed)
Cannot tolerate metoprolol succinate, discontinued today. BP above goal.  Continue Amlodipine 10 mg. Start losartan 50 mg.  Will have her monitor BP at home and follow up in 2-3 weeks. Repeat BMP next visit.

## 2018-08-07 NOTE — Patient Instructions (Signed)
Start losartan 50 mg tablets for blood pressure. Take this in the morning.  Continue amlodipine 10 mg for blood pressure. Continue this at bedtime.  Stop metoprolol succinate 25 mg.   Schedule a follow up visit for 2-3 weeks for blood pressure check.  It was a pleasure to see you today!

## 2018-08-21 ENCOUNTER — Ambulatory Visit (INDEPENDENT_AMBULATORY_CARE_PROVIDER_SITE_OTHER): Payer: PPO | Admitting: Primary Care

## 2018-08-21 ENCOUNTER — Encounter: Payer: Self-pay | Admitting: Primary Care

## 2018-08-21 VITALS — BP 154/80 | HR 96 | Temp 98.0°F | Ht 67.5 in | Wt 221.8 lb

## 2018-08-21 DIAGNOSIS — R7303 Prediabetes: Secondary | ICD-10-CM | POA: Diagnosis not present

## 2018-08-21 DIAGNOSIS — F411 Generalized anxiety disorder: Secondary | ICD-10-CM

## 2018-08-21 DIAGNOSIS — R5383 Other fatigue: Secondary | ICD-10-CM

## 2018-08-21 DIAGNOSIS — I10 Essential (primary) hypertension: Secondary | ICD-10-CM | POA: Diagnosis not present

## 2018-08-21 LAB — COMPREHENSIVE METABOLIC PANEL
ALK PHOS: 97 U/L (ref 39–117)
ALT: 13 U/L (ref 0–35)
AST: 14 U/L (ref 0–37)
Albumin: 4.2 g/dL (ref 3.5–5.2)
BUN: 8 mg/dL (ref 6–23)
CALCIUM: 9.6 mg/dL (ref 8.4–10.5)
CO2: 29 mEq/L (ref 19–32)
Chloride: 103 mEq/L (ref 96–112)
Creatinine, Ser: 0.94 mg/dL (ref 0.40–1.20)
GFR: 59.33 mL/min — ABNORMAL LOW (ref >60.00)
Glucose, Bld: 121 mg/dL — ABNORMAL HIGH (ref 70–99)
Potassium: 4.7 mEq/L (ref 3.5–5.1)
Sodium: 139 mEq/L (ref 135–145)
TOTAL PROTEIN: 7.4 g/dL (ref 6.0–8.3)
Total Bilirubin: 0.7 mg/dL (ref 0.2–1.2)

## 2018-08-21 LAB — LIPID PANEL
Cholesterol: 159 mg/dL (ref 0–200)
HDL: 44.8 mg/dL (ref >39.00)
LDL Cholesterol: 96 mg/dL (ref 0–99)
NonHDL: 114.1
Total CHOL/HDL Ratio: 4
Triglycerides: 90 mg/dL (ref 0.0–149.0)
VLDL: 18 mg/dL (ref 0.0–40.0)

## 2018-08-21 LAB — HEMOGLOBIN A1C: Hgb A1c MFr Bld: 5.9 % (ref 4.6–6.5)

## 2018-08-21 LAB — VITAMIN D 25 HYDROXY (VIT D DEFICIENCY, FRACTURES): VITD: <7 ng/mL — ABNORMAL LOW (ref 30.00–100.00)

## 2018-08-21 LAB — VITAMIN B12: Vitamin B-12: 347 pg/mL (ref 211–911)

## 2018-08-21 LAB — TSH: TSH: 0.72 u[IU]/mL (ref 0.35–4.50)

## 2018-08-21 MED ORDER — LOSARTAN POTASSIUM 100 MG PO TABS: 100.0000 mg | ORAL_TABLET | Freq: Every day | ORAL | 0 refills | Status: DC

## 2018-08-21 NOTE — Progress Notes (Signed)
Subjective:    Patient ID: Jennifer Cannon, female    DOB: [birth year], 68 y.o.   MRN: [id number]  HPI  Jennifer Cannon is a 68 year old female who presents today for follow up of hypertension.  She was last evaluated on 08/07/18 with reports of continued hypertension on Amlodipine 10 mg and metoprolol succinate 25 mg. She endorsed side effects to metoprolol succinate so this was discontinued. She has not been able to tolerate lisinopril or HCTZ in the past. During her last visit she was initiated on losartan 50 mg, Amlodipine 10 mg was continued.  BP Readings from Last 3 Encounters:  08/21/18 (!) 154/80  08/07/18 (!) 160/94  05/15/18 136/84    Since her last visit she continues to experience bilateral ankle edema that will occur shortly after taking Amlodipine. She also endorses feeling anxious in the mornings, shortness of breath, feeling weak. She thinks this is a side effect of her losartan. She's checked her BP a few times on both amlodipine and losartan which was 150-160's/70-80's.   She denies chest pain.   Review of Systems  Constitutional: Positive for fatigue.  Respiratory: Positive for shortness of breath.   Cardiovascular: Positive for leg swelling. Negative for chest pain.  Neurological: Positive for dizziness. Negative for headaches.       Past Medical History:  Diagnosis Date  . Asthma   . Essential hypertension   . GAD (generalized anxiety disorder)   . Impacted cerumen of right ear 03/13/2018     Social History   Socioeconomic History  . Marital status: Widowed    Spouse name: Not on file  . Number of children: Not on file  . Years of education: Not on file  . Highest education level: Not on file  Occupational History  . Not on file  Social Needs  . Financial resource strain: Not on file  . Food insecurity:    Worry: Not on file    Inability: Not on file  . Transportation needs:    Medical: Not on file    Non-medical: Not on file  Tobacco Use  .  Smoking status: Former Smoker    Packs/day: 0.50    Types: Cigarettes  . Smokeless tobacco: Never Used  Substance and Sexual Activity  . Alcohol use: No  . Drug use: No  . Sexual activity: Not Currently  Lifestyle  . Physical activity:    Days per week: Not on file    Minutes per session: Not on file  . Stress: Not on file  Relationships  . Social connections:    Talks on phone: Not on file    Gets together: Not on file    Attends religious service: Not on file    Active member of club or organization: Not on file    Attends meetings of clubs or organizations: Not on file    Relationship status: Not on file  . Intimate partner violence:    Fear of current or ex partner: Not on file    Emotionally abused: Not on file    Physically abused: Not on file    Forced sexual activity: Not on file  Other Topics Concern  . Not on file  Social History Narrative   Widowed.   Retired, once worked in Physiological scientist.   Enjoys crocheting, making cards.      Past Surgical History:  Procedure Laterality Date  . CHOLECYSTECTOMY  2000  . TONSILLECTOMY      Family History  Problem Relation Age of Onset  . Hypertension Mother   . Diabetes Mother   . Hypertension Father   . Stroke Father   . COPD Sister   . Cancer Sister   . Hypertension Sister   . Hypertension Sister     Allergies  Allergen Reactions  . Amlodipine     Ankle edema, shortness of breath, dizziness  . Hctz [Hydrochlorothiazide] Other (See Comments)    Dry mouth, fatigue  . Lisinopril   . Metoprolol Succinate [Metoprolol] Other (See Comments)    Weakness, shortness of breath    Current Outpatient Medications on File Prior to Visit  Medication Sig Dispense Refill  . albuterol (PROVENTIL HFA;VENTOLIN HFA) 108 (90 BASE) MCG/ACT inhaler Inhale 1-2 puffs into the lungs every 6 (six) hours as needed for wheezing. 1 Inhaler 0  . diphenhydrAMINE (BENADRYL) 25 MG tablet Take 25 mg by mouth 2 (two) times daily.       . Melatonin 5 MG TABS Take by mouth.     No current facility-administered medications on file prior to visit.     BP (!) 154/80   Pulse 96   Temp 98 F (36.7 C) (Oral)   Ht 5' 7.5" (1.715 m)   Wt 221 lb 12 oz (100.6 kg)   SpO2 97%   BMI 34.22 kg/m    Objective:   Physical Exam  Constitutional: She appears well-nourished.  Neck: Neck supple.  Cardiovascular: Normal rate and regular rhythm.  Respiratory: Effort normal and breath sounds normal.  Skin: Skin is warm and dry.  Psychiatric: She has a normal mood and affect.           Assessment & Plan:

## 2018-08-21 NOTE — Assessment & Plan Note (Addendum)
Last A1C of 6.1 in June 2019, repeat A1C pending today.  Encouraged regular exercise, healthy diet.

## 2018-08-21 NOTE — Assessment & Plan Note (Addendum)
Uncontrolled. Also intolerant to Amlodipine. She now cannot tolerate Lisinopril, HCTZ, Amlodipine, and metoprolol succinate. Do suspect anxiety to be playing a role, she kindly declines treatment.  Will start by increasing Losartan to 100 mg, discontinue Amlodipine.  ECG today with NSR, rate of 92, No ST changes, PAC/PVC. No prior to compare. Will have her back in 2-3 weeks for BP check. She will monitor BP readings. Also consider anxiety treatment next visit if she agrees.

## 2018-08-21 NOTE — Assessment & Plan Note (Signed)
Chronic since her husband's death one year ago. Suspect anxiety to be contributing, also hypertension. Check labs today, ECG today completed and was without acute process.

## 2018-08-21 NOTE — Assessment & Plan Note (Signed)
Uncontrolled, kindly declines treatment. Will continue to monitor and encourage treatment. Suspect this to be contributing to hypertension.

## 2018-08-21 NOTE — Patient Instructions (Addendum)
Stop by the lab prior to leaving today. I will notify you of your results once received.   We've increased the does of your losartan to 100 mg. You may take two of the 50 mg tablets to equal 100 mg until your bottle is empty.  Schedule a follow up visit in 2-3 weeks for blood pressure check and anxiety.  It was a pleasure to see you today!

## 2018-08-22 ENCOUNTER — Other Ambulatory Visit: Payer: Self-pay | Admitting: Primary Care

## 2018-08-22 DIAGNOSIS — E559 Vitamin D deficiency, unspecified: Secondary | ICD-10-CM

## 2018-08-22 MED ORDER — VITAMIN D (ERGOCALCIFEROL) 1.25 MG (50000 UNIT) PO CAPS: ORAL_CAPSULE | ORAL | 0 refills | Status: DC

## 2018-08-30 ENCOUNTER — Other Ambulatory Visit: Payer: Self-pay | Admitting: Primary Care

## 2018-08-30 DIAGNOSIS — I10 Essential (primary) hypertension: Secondary | ICD-10-CM

## 2018-09-04 ENCOUNTER — Other Ambulatory Visit: Payer: Self-pay | Admitting: Primary Care

## 2018-09-04 ENCOUNTER — Encounter: Payer: Self-pay | Admitting: Primary Care

## 2018-09-04 ENCOUNTER — Ambulatory Visit (INDEPENDENT_AMBULATORY_CARE_PROVIDER_SITE_OTHER): Payer: PPO | Admitting: Primary Care

## 2018-09-04 VITALS — BP 160/110 | HR 91 | Temp 98.0°F | Ht 67.75 in | Wt 220.0 lb

## 2018-09-04 DIAGNOSIS — I10 Essential (primary) hypertension: Secondary | ICD-10-CM

## 2018-09-04 DIAGNOSIS — F411 Generalized anxiety disorder: Secondary | ICD-10-CM

## 2018-09-04 MED ORDER — SERTRALINE HCL 25 MG PO TABS: 25.0000 mg | ORAL_TABLET | Freq: Every day | ORAL | 1 refills | Status: DC

## 2018-09-04 NOTE — Patient Instructions (Signed)
Start sertraline (Zoloft) 25 mg daily for anxiety.  Continue losartan 100 mg daily for blood pressure.  You will be contacted regarding your ultrasound of the kidneys.  Please let us know if you have not been contacted within one week.   Schedule a follow up visit for 6 weeks for further evaluation of anxiety and blood pressure.  It was a pleasure to see you today!

## 2018-09-04 NOTE — Assessment & Plan Note (Signed)
Uncontrolled on losartan 100 mg, but does feel better. Intolerant to Amlodipine, HCTZ, metoprolol succinate, Lisinopril. Consider secondary causes for uncontrolled hypertension including anxiety vs renal artery stenosis.  She does have slight decline in renal function over time, therefore, it is very reasonable to check renal artery ultrasound. Will also treat anxiety as she does exhibit daily anxiety symptoms.

## 2018-09-04 NOTE — Assessment & Plan Note (Signed)
Uncontrolled, agrees to treatment today. GAD 7 score of 13, denies depression. Discussed options for treatment and she agrees to Zoloft 25 mg.   We discussed possible side effects of headache, GI upset, drowsiness, and SI/HI. If thoughts of SI/HI develop, we discussed to present to the emergency immediately. Patient verbalized understanding.   Follow up in 6 weeks for re-evaluation.

## 2018-09-04 NOTE — Progress Notes (Signed)
Subjective:    Patient ID: Jennifer Cannon, female    DOB: Aug 20, 1950, 68 y.o.   MRN: [id number]  HPI  Jennifer Cannon is a 68 year old female who presents today for follow up of hypertension and anxiety.   She was last evaluated on 08/21/18 for follow up of hypertension. Hypertension uncontrolled on Amlodipine 10 mg and Losartan. Endorsed during her last visit that she could no longer tolerate Amlodipine 10 mg, also prior history of intolerance to metoprolol succinate, lisinopril, and HCTZ. During her last visit some of her uncontrolled hypertension was thought to be secondary to anxiety. We increased her losartan to 100 mg and asked her to follow up today.  Since her last visit she's checking her BP at home which is running 150's/80's-90's. She is compliant to her losartan 100 mg and has no problems.    BP Readings from Last 3 Encounters:  09/04/18 (!) 160/110  08/21/18 (!) 154/80  08/07/18 (!) 160/94   She endorses a chronic history of anxiety since the passing of her husband over one year ago. She has been followed by [facility] grief counseling for the last year and will be ending her monthly visits. Symptoms include feeling nervous, feeling anxious, daily worry. She has not taken anything for anxiety on a routine basis.  GAD 7 score of 13.   Review of Systems  Respiratory: Negative for shortness of breath.   Cardiovascular: Negative for chest pain and leg swelling.  Neurological: Negative for dizziness and headaches.  Psychiatric/Behavioral: The patient is nervous/anxious.        See HPI       Past Medical History:  Diagnosis Date  . Asthma   . Essential hypertension   . GAD (generalized anxiety disorder)   . Impacted cerumen of right ear 03/13/2018     Social History   Socioeconomic History  . Marital status: Widowed    Spouse name: Not on file  . Number of children: Not on file  . Years of education: Not on file  . Highest education level: Not on file  Occupational  History  . Not on file  Social Needs  . Financial resource strain: Not on file  . Food insecurity:    Worry: Not on file    Inability: Not on file  . Transportation needs:    Medical: Not on file    Non-medical: Not on file  Tobacco Use  . Smoking status: Former Smoker    Packs/day: 0.50    Types: Cigarettes  . Smokeless tobacco: Never Used  Substance and Sexual Activity  . Alcohol use: No  . Drug use: No  . Sexual activity: Not Currently  Lifestyle  . Physical activity:    Days per week: Not on file    Minutes per session: Not on file  . Stress: Not on file  Relationships  . Social connections:    Talks on phone: Not on file    Gets together: Not on file    Attends religious service: Not on file    Active member of club or organization: Not on file    Attends meetings of clubs or organizations: Not on file    Relationship status: Not on file  . Intimate partner violence:    Fear of current or ex partner: Not on file    Emotionally abused: Not on file    Physically abused: Not on file    Forced sexual activity: Not on file  Other Topics Concern  .  Not on file  Social History Narrative   Widowed.   Retired, once worked in Physiological scientist.   Enjoys crocheting, making cards.      Past Surgical History:  Procedure Laterality Date  . CHOLECYSTECTOMY  2000  . TONSILLECTOMY      Family History  Problem Relation Age of Onset  . Hypertension Mother   . Diabetes Mother   . Hypertension Father   . Stroke Father   . COPD Sister   . Cancer Sister   . Hypertension Sister   . Hypertension Sister     Allergies  Allergen Reactions  . Amlodipine     Ankle edema, shortness of breath, dizziness  . Hctz [Hydrochlorothiazide] Other (See Comments)    Dry mouth, fatigue  . Lisinopril   . Metoprolol Succinate [Metoprolol] Other (See Comments)    Weakness, shortness of breath    Current Outpatient Medications on File Prior to Visit  Medication Sig Dispense Refill   . albuterol (PROVENTIL HFA;VENTOLIN HFA) 108 (90 BASE) MCG/ACT inhaler Inhale 1-2 puffs into the lungs every 6 (six) hours as needed for wheezing. 1 Inhaler 0  . diphenhydrAMINE (BENADRYL) 25 MG tablet Take 25 mg by mouth 2 (two) times daily.     Marland Kitchen losartan (COZAAR) 100 MG tablet Take 1 tablet (100 mg total) by mouth daily. For blood pressure. 30 tablet 0  . Melatonin 5 MG TABS Take by mouth.    . Vitamin D, Ergocalciferol, (DRISDOL) 1.25 MG (50000 UT) CAPS capsule Take 1 capsule by mouth once weekly for 12 weeks. 12 capsule 0   No current facility-administered medications on file prior to visit.     BP (!) 160/110   Pulse 91   Temp 98 F (36.7 C) (Oral)   Ht 5' 7.75" (1.721 m)   Wt 220 lb (99.8 kg)   SpO2 99%   BMI 33.70 kg/m    Objective:   Physical Exam  Constitutional: She appears well-nourished.  Neck: Neck supple.  Cardiovascular: Normal rate and regular rhythm.  Respiratory: Effort normal and breath sounds normal.  Skin: Skin is warm and dry.  Psychiatric: She has a normal mood and affect.           Assessment & Plan:

## 2018-09-08 DIAGNOSIS — F411 Generalized anxiety disorder: Secondary | ICD-10-CM

## 2018-09-09 MED ORDER — ESCITALOPRAM OXALATE 10 MG PO TABS: 10.0000 mg | ORAL_TABLET | Freq: Every day | ORAL | 1 refills | Status: DC

## 2018-09-14 ENCOUNTER — Other Ambulatory Visit: Payer: Self-pay | Admitting: Primary Care

## 2018-09-14 DIAGNOSIS — I10 Essential (primary) hypertension: Secondary | ICD-10-CM

## 2018-09-22 ENCOUNTER — Encounter (HOSPITAL_COMMUNITY): Payer: PPO

## 2018-10-01 ENCOUNTER — Other Ambulatory Visit: Payer: Self-pay | Admitting: Primary Care

## 2018-10-01 DIAGNOSIS — F411 Generalized anxiety disorder: Secondary | ICD-10-CM

## 2018-10-02 ENCOUNTER — Encounter (HOSPITAL_COMMUNITY): Payer: PPO

## 2018-10-08 DIAGNOSIS — F411 Generalized anxiety disorder: Secondary | ICD-10-CM

## 2018-10-08 MED ORDER — ESCITALOPRAM OXALATE 10 MG PO TABS: 5.0000 mg | ORAL_TABLET | Freq: Every day | ORAL | 1 refills | Status: DC

## 2018-10-12 ENCOUNTER — Other Ambulatory Visit: Payer: Self-pay | Admitting: Primary Care

## 2018-10-12 DIAGNOSIS — I10 Essential (primary) hypertension: Secondary | ICD-10-CM

## 2018-10-20 ENCOUNTER — Ambulatory Visit: Payer: PPO | Admitting: Primary Care

## 2018-10-22 ENCOUNTER — Ambulatory Visit (INDEPENDENT_AMBULATORY_CARE_PROVIDER_SITE_OTHER): Payer: PPO | Admitting: Primary Care

## 2018-10-22 ENCOUNTER — Encounter: Payer: Self-pay | Admitting: Primary Care

## 2018-10-22 DIAGNOSIS — F411 Generalized anxiety disorder: Secondary | ICD-10-CM | POA: Diagnosis not present

## 2018-10-22 DIAGNOSIS — I10 Essential (primary) hypertension: Secondary | ICD-10-CM

## 2018-10-22 MED ORDER — METOPROLOL SUCCINATE ER 50 MG PO TB24: 50.0000 mg | ORAL_TABLET | Freq: Every day | ORAL | 0 refills | Status: DC

## 2018-10-22 MED ORDER — LOSARTAN POTASSIUM 100 MG PO TABS: ORAL_TABLET | ORAL | 3 refills | Status: DC

## 2018-10-22 NOTE — Progress Notes (Signed)
Subjective:    Patient ID: Jennifer Cannon, female    DOB: 05/14/1951, 68 y.o.   MRN: [id number]  HPI  Virtual Visit via Video Note  I connected with Jennifer Cannon on 10/22/18 at 11:00 AM EDT by a video enabled telemedicine application and verified that I am speaking with the correct person using two identifiers.   I discussed the limitations of evaluation and management by telemedicine and the availability of in person appointments. The patient expressed understanding and agreed to proceed.  She is at home, I am in the office.  We attempted to connect via video but the video failed. We had to complete our visit via phone.   History of Present Illness:   Jennifer Cannon is a 68 year old female who presents today via video for follow up of hypertension and anxiety.  She is currently managed on Losartan 100 mg. She was previously managed on Amlodipine, HCTZ, Lisinopril, metoprolol but could not tolerate. She was last evaluated on 09/04/18 with uncontrolled readings on losartan 100 mg but she also had uncontrolled anxiety, we decided to treat her anxiety with low dose Lexapro.   Since her last visit she' been checking her BP at home a few times which is running 160/102 this morning, also 170's/90-low 100's. HR is running in the 70's-80's. She is compliant to her Lexapro and is taking 5 mg daily with improvement in anxiety. She's feeling well, denies ankle edema, chest pain, and shortness of breath.    Observations/Objective:  Alert and oriented. Appears well. No distress.  Assessment and Plan:  See problem based charting  Follow Up Instructions:  Continue losartan 100 mg tablets once daily for blood pressure.   Start metoprolol succinate 50 mg once daily for blood pressure.  Start monitoring your blood pressure daily, around the same time of day, for the next 2-3 weeks.  Ensure that you have rested for 30 minutes prior to checking your blood pressure. Record your readings and we  will discuss them at your next visit.   Also monitor your heart rate, call me if you see consistent readings below 60.  Jennifer Burtonmily will contact you for an appointment for 2 weeks.   It was a pleasure to see you today! Mayra ReelKate Cecilia Vancleve, NP-C    I discussed the assessment and treatment plan with the patient. The patient was provided an opportunity to ask questions and all were answered. The patient agreed with the plan and demonstrated an understanding of the instructions.   The patient was advised to call back or seek an in-person evaluation if the symptoms worsen or if the condition fails to improve as anticipated.    Doreene NestKatherine K Miranda Garber, NP   Review of Systems  Eyes: Negative for visual disturbance.  Respiratory: Negative for shortness of breath.   Cardiovascular: Negative for chest pain and leg swelling.  Neurological: Negative for dizziness, weakness and headaches.       Past Medical History:  Diagnosis Date  . Asthma   . Essential hypertension   . GAD (generalized anxiety disorder)   . Impacted cerumen of right ear 03/13/2018     Social History   Socioeconomic History  . Marital status: Widowed    Spouse name: Not on file  . Number of children: Not on file  . Years of education: Not on file  . Highest education level: Not on file  Occupational History  . Not on file  Social Needs  . Financial resource strain: Not on  file  . Food insecurity:    Worry: Not on file    Inability: Not on file  . Transportation needs:    Medical: Not on file    Non-medical: Not on file  Tobacco Use  . Smoking status: Former Smoker    Packs/day: 0.50    Types: Cigarettes  . Smokeless tobacco: Never Used  Substance and Sexual Activity  . Alcohol use: No  . Drug use: No  . Sexual activity: Not Currently  Lifestyle  . Physical activity:    Days per week: Not on file    Minutes per session: Not on file  . Stress: Not on file  Relationships  . Social connections:    Talks on phone: Not  on file    Gets together: Not on file    Attends religious service: Not on file    Active member of club or organization: Not on file    Attends meetings of clubs or organizations: Not on file    Relationship status: Not on file  . Intimate partner violence:    Fear of current or ex partner: Not on file    Emotionally abused: Not on file    Physically abused: Not on file    Forced sexual activity: Not on file  Other Topics Concern  . Not on file  Social History Narrative   Widowed.   Retired, once worked in Physiological scientist.   Enjoys crocheting, making cards.      Past Surgical History:  Procedure Laterality Date  . CHOLECYSTECTOMY  2000  . TONSILLECTOMY      Family History  Problem Relation Age of Onset  . Hypertension Mother   . Diabetes Mother   . Hypertension Father   . Stroke Father   . COPD Sister   . Cancer Sister   . Hypertension Sister   . Hypertension Sister     Allergies  Allergen Reactions  . Amlodipine     Ankle edema, shortness of breath, dizziness  . Hctz [Hydrochlorothiazide] Other (See Comments)    Dry mouth, fatigue  . Lisinopril   . Metoprolol Succinate [Metoprolol] Other (See Comments)    Weakness, shortness of breath    Current Outpatient Medications on File Prior to Visit  Medication Sig Dispense Refill  . albuterol (PROVENTIL HFA;VENTOLIN HFA) 108 (90 BASE) MCG/ACT inhaler Inhale 1-2 puffs into the lungs every 6 (six) hours as needed for wheezing. 1 Inhaler 0  . diphenhydrAMINE (BENADRYL) 25 MG tablet Take 25 mg by mouth 2 (two) times daily.     Marland Kitchen escitalopram (LEXAPRO) 10 MG tablet Take 0.5 tablets (5 mg total) by mouth daily. For anxiety. 45 tablet 1  . Melatonin 5 MG TABS Take by mouth.    . Vitamin D, Ergocalciferol, (DRISDOL) 1.25 MG (50000 UT) CAPS capsule Take 1 capsule by mouth once weekly for 12 weeks. 12 capsule 0   No current facility-administered medications on file prior to visit.     There were no vitals taken for  this visit.   Objective:   Physical Exam  Constitutional: She is oriented to person, place, and time. She appears well-nourished.  Respiratory: Effort normal.  Neurological: She is alert and oriented to person, place, and time.  Psychiatric: She has a normal mood and affect.           Assessment & Plan:

## 2018-10-22 NOTE — Assessment & Plan Note (Signed)
Improved on Lexapro 5 mg, denies SI/HI. Continues same.

## 2018-10-22 NOTE — Assessment & Plan Note (Signed)
Continues to remain above goal despite treatment with losartan 100 mg and treating anxiety. Intolerant to numerous medications but is willing to re-try metoprolol.   Refills provided for losartan 100 mg.  Rx for metoprolol succinate 50 mg sent to pharmacy.  Discussed to monitor HR and BP over the next two weeks. We will have her scheduled for a 2 week nurse visit for BP check following with a virtual visit for BP check.  She will call sooner if her heart rate drops below 60 consistently.

## 2018-10-22 NOTE — Patient Instructions (Addendum)
  Continue losartan 100 mg tablets once daily for blood pressure.   Start metoprolol succinate 50 mg once daily for blood pressure.  Start monitoring your blood pressure daily, around the same time of day, for the next 2-3 weeks.  Ensure that you have rested for 30 minutes prior to checking your blood pressure. Record your readings and we will discuss them at your next visit.   Also monitor your heart rate, call me if you see consistent readings below 60.  Irving Burton will contact you for an appointment for 2 weeks.   It was a pleasure to see you today! Mayra Reel, NP-C

## 2018-11-03 ENCOUNTER — Other Ambulatory Visit: Payer: Self-pay | Admitting: Primary Care

## 2018-11-03 DIAGNOSIS — F411 Generalized anxiety disorder: Secondary | ICD-10-CM

## 2018-11-05 ENCOUNTER — Ambulatory Visit: Payer: PPO

## 2018-11-05 ENCOUNTER — Ambulatory Visit (INDEPENDENT_AMBULATORY_CARE_PROVIDER_SITE_OTHER): Payer: PPO | Admitting: Primary Care

## 2018-11-05 ENCOUNTER — Other Ambulatory Visit: Payer: Self-pay

## 2018-11-05 ENCOUNTER — Encounter: Payer: Self-pay | Admitting: Primary Care

## 2018-11-05 VITALS — BP 154/96 | HR 99 | Temp 98.2°F

## 2018-11-05 DIAGNOSIS — F411 Generalized anxiety disorder: Secondary | ICD-10-CM | POA: Diagnosis not present

## 2018-11-05 DIAGNOSIS — I10 Essential (primary) hypertension: Secondary | ICD-10-CM | POA: Diagnosis not present

## 2018-11-05 MED ORDER — ESCITALOPRAM OXALATE 5 MG PO TABS: 5.0000 mg | ORAL_TABLET | Freq: Every day | ORAL | 1 refills | Status: DC

## 2018-11-05 MED ORDER — HYDRALAZINE HCL 50 MG PO TABS: 50.0000 mg | ORAL_TABLET | Freq: Two times a day (BID) | ORAL | 0 refills | Status: DC

## 2018-11-05 NOTE — Assessment & Plan Note (Signed)
Slightly improved but still above goal. HR cannot handle an increase in metoprolol succinate so we will continue with 50 mg. Continue losartan 100 mg. Add in hydralazine 50 mg BID.  Follow up in 2 weeks for BP nurse visit and BP check with me.

## 2018-11-05 NOTE — Progress Notes (Signed)
Subjective:    Patient ID: Jennifer Cannon, female    DOB: 07/25/1950, 68 y.o.   MRN: [id number]  HPI  Virtual Visit via Video Note  I connected with Jennifer Cannon on 11/05/18 at  3:20 PM EDT by a video enabled telemedicine application and verified that I am speaking with the correct person using two identifiers.  Location: Patient: Home Provider: Office   I discussed the limitations of evaluation and management by telemedicine and the availability of in person appointments. The patient expressed understanding and agreed to proceed.  We attempted a video visit but her connection eventually failed. We had to conduct most of our visit via phone.   History of Present Illness:  Jennifer Cannon is a 68 year old female who presents today for follow up of hypertension.  She was last evaluated on 10/22/18 for hypertension and anxiety follow up and endorsed improvement with anxiety but continued elevated blood pressure readings on Losartan 100 mg. Given these readings she was initiated back on metoprolol succinate 50 mg.  Since her last visit she's checking her BP and HR at home and is getting readings of: 158/85, 61 HR, 167/106, 60 HR, 164/96, HR of 62, 177/97, 60 HR. She's not had any problems on metoprolol succinate. She denies dizziness, shortness of breath, dizziness.   BP Readings from Last 3 Encounters:  11/05/18 (!) 154/96  09/04/18 (!) 160/110  08/21/18 (!) 154/80      Observations/Objective:  Speaking in complete sentences. Alert and oriented. No distress.  Assessment and Plan:  See problem based charting.   Follow Up Instructions:  Continue losartan 100 mg daily for blood pressure. Continue metoprolol succinate once daily for blood pressure.  Start hydralazine 50 mg twice daily for blood pressure.   Continue to monitor your blood pressure and we will catch back up in 2 weeks.  We will be in contact soon regarding follow up appointments.  It was a pleasure to  see you today! Mayra ReelKate Iran Rowe, NP-C    I discussed the assessment and treatment plan with the patient. The patient was provided an opportunity to ask questions and all were answered. The patient agreed with the plan and demonstrated an understanding of the instructions.   The patient was advised to call back or seek an in-person evaluation if the symptoms worsen or if the condition fails to improve as anticipated.     Doreene NestKatherine K Nazaire Cordial, NP    Review of Systems  Eyes: Negative for visual disturbance.  Respiratory: Negative for shortness of breath.   Cardiovascular: Negative for chest pain.  Neurological: Negative for dizziness.       Past Medical History:  Diagnosis Date  . Asthma   . Essential hypertension   . GAD (generalized anxiety disorder)   . Impacted cerumen of right ear 03/13/2018     Social History   Socioeconomic History  . Marital status: Widowed    Spouse name: Not on file  . Number of children: Not on file  . Years of education: Not on file  . Highest education level: Not on file  Occupational History  . Not on file  Social Needs  . Financial resource strain: Not on file  . Food insecurity:    Worry: Not on file    Inability: Not on file  . Transportation needs:    Medical: Not on file    Non-medical: Not on file  Tobacco Use  . Smoking status: Former Smoker    Packs/day:  0.50    Types: Cigarettes  . Smokeless tobacco: Never Used  Substance and Sexual Activity  . Alcohol use: No  . Drug use: No  . Sexual activity: Not Currently  Lifestyle  . Physical activity:    Days per week: Not on file    Minutes per session: Not on file  . Stress: Not on file  Relationships  . Social connections:    Talks on phone: Not on file    Gets together: Not on file    Attends religious service: Not on file    Active member of club or organization: Not on file    Attends meetings of clubs or organizations: Not on file    Relationship status: Not on file  .  Intimate partner violence:    Fear of current or ex partner: Not on file    Emotionally abused: Not on file    Physically abused: Not on file    Forced sexual activity: Not on file  Other Topics Concern  . Not on file  Social History Narrative   Widowed.   Retired, once worked in Physiological scientist.   Enjoys crocheting, making cards.      Past Surgical History:  Procedure Laterality Date  . CHOLECYSTECTOMY  2000  . TONSILLECTOMY      Family History  Problem Relation Age of Onset  . Hypertension Mother   . Diabetes Mother   . Hypertension Father   . Stroke Father   . COPD Sister   . Cancer Sister   . Hypertension Sister   . Hypertension Sister     Allergies  Allergen Reactions  . Amlodipine     Ankle edema, shortness of breath, dizziness  . Hctz [Hydrochlorothiazide] Other (See Comments)    Dry mouth, fatigue  . Lisinopril     Current Outpatient Medications on File Prior to Visit  Medication Sig Dispense Refill  . albuterol (PROVENTIL HFA;VENTOLIN HFA) 108 (90 BASE) MCG/ACT inhaler Inhale 1-2 puffs into the lungs every 6 (six) hours as needed for wheezing. 1 Inhaler 0  . diphenhydrAMINE (BENADRYL) 25 MG tablet Take 25 mg by mouth 2 (two) times daily.     Marland Kitchen losartan (COZAAR) 100 MG tablet TAKE 1 TABLET BY MOUTH EVERY DAY FOR BLOOD PRESSURE 90 tablet 3  . Melatonin 5 MG TABS Take by mouth.    . metoprolol succinate (TOPROL-XL) 50 MG 24 hr tablet Take 1 tablet (50 mg total) by mouth daily. For blood pressure. Take with or immediately following a meal. 30 tablet 0  . Vitamin D, Ergocalciferol, (DRISDOL) 1.25 MG (50000 UT) CAPS capsule Take 1 capsule by mouth once weekly for 12 weeks. 12 capsule 0   No current facility-administered medications on file prior to visit.     BP (!) 154/96   Pulse 99   Temp 98.2 F (36.8 C) (Tympanic)   SpO2 99%    Objective:   Physical Exam  Constitutional: She is oriented to person, place, and time.  Respiratory: Effort normal.  No respiratory distress.  Neurological: She is alert and oriented to person, place, and time.  Psychiatric: She has a normal mood and affect.           Assessment & Plan:

## 2018-11-05 NOTE — Patient Instructions (Signed)
Continue losartan 100 mg daily for blood pressure. Continue metoprolol succinate once daily for blood pressure.  Start hydralazine 50 mg twice daily for blood pressure.   Continue to monitor your blood pressure and we will catch back up in 2 weeks.  We will be in contact soon regarding follow up appointments.  It was a pleasure to see you today! Mayra Reel, NP-C

## 2018-11-14 ENCOUNTER — Other Ambulatory Visit: Payer: Self-pay | Admitting: Primary Care

## 2018-11-14 DIAGNOSIS — I10 Essential (primary) hypertension: Secondary | ICD-10-CM

## 2018-11-24 ENCOUNTER — Other Ambulatory Visit: Payer: Self-pay | Admitting: Primary Care

## 2018-11-24 ENCOUNTER — Ambulatory Visit (INDEPENDENT_AMBULATORY_CARE_PROVIDER_SITE_OTHER): Payer: PPO | Admitting: Primary Care

## 2018-11-24 ENCOUNTER — Ambulatory Visit: Payer: PPO

## 2018-11-24 ENCOUNTER — Encounter: Payer: Self-pay | Admitting: Primary Care

## 2018-11-24 DIAGNOSIS — I10 Essential (primary) hypertension: Secondary | ICD-10-CM

## 2018-11-24 MED ORDER — HYDRALAZINE HCL 50 MG PO TABS: ORAL_TABLET | ORAL | 0 refills | Status: DC

## 2018-11-24 NOTE — Progress Notes (Signed)
Subjective:    Patient ID: Jennifer Cannon, female    DOB: 03/22/1951, 67 y.o.   MRN: [id number]  HPI  Virtual Visit via Video Note  I connected with Jennifer Cannon on 11/24/18 at 11:40 AM EDT by a video enabled telemedicine application and verified that I am speaking with the correct person using two identifiers.  Location: Patient: Home Provider: Office   I discussed the limitations of evaluation and management by telemedicine and the availability of in person appointments. The patient expressed understanding and agreed to proceed.  History of Present Illness:  Jennifer Cannon is a 68 year old female who presents today for follow up of hypertension.  She is currently managed on metoprolol succinate 50 mg, losartan 100 mg, and hydralazine 50 mg BID. Hydralazine 50 mg BID was added during her last visit given continued uncontrolled BP readings. She cannot tolerate Amlodipine or HCTZ. She is actually taking hydralazine 50 mg at bedtime as of now due to symptoms of headaches and GI upset which have resolved.   She's checking her BP at home which is running 136-149's/70's-80's with  HR in the 70-'s. She denies dizziness, chest pain, headaches. She came to our office this morning and had BP checked which was 144/92.  BP Readings from Last 3 Encounters:  11/24/18 (!) 144/92  11/05/18 (!) 154/96  09/04/18 (!) 160/110      Observations/Objective:  Alert and oriented. Appears well, not sickly. No distress. Speaking in complete sentences.  Assessment and Plan:  See problem based charting.  Follow Up Instructions:  Continue metoprolol succinate 50 mg, losartan 100 mg, and hydralazine 50 mg at bedtime. Try to add in 25 mg of hydralazine in the morning if possible.  Continue to monitor your blood pressure and notify me if you see readings at or above 140/90.  It was a pleasure to see you today! Mayra Reel, NP-C    I discussed the assessment and treatment plan with the patient.  The patient was provided an opportunity to ask questions and all were answered. The patient agreed with the plan and demonstrated an understanding of the instructions.   The patient was advised to call back or seek an in-person evaluation if the symptoms worsen or if the condition fails to improve as anticipated.     Doreene Nest, NP    Review of Systems  Respiratory: Negative for shortness of breath.   Cardiovascular: Negative for chest pain.  Gastrointestinal: Negative for nausea.  Neurological: Negative for dizziness and headaches.       Past Medical History:  Diagnosis Date  . Asthma   . Essential hypertension   . GAD (generalized anxiety disorder)   . Impacted cerumen of right ear 03/13/2018     Social History   Socioeconomic History  . Marital status: Widowed    Spouse name: Not on file  . Number of children: Not on file  . Years of education: Not on file  . Highest education level: Not on file  Occupational History  . Not on file  Social Needs  . Financial resource strain: Not on file  . Food insecurity:    Worry: Not on file    Inability: Not on file  . Transportation needs:    Medical: Not on file    Non-medical: Not on file  Tobacco Use  . Smoking status: Former Smoker    Packs/day: 0.50    Types: Cigarettes  . Smokeless tobacco: Never Used  Substance and  Sexual Activity  . Alcohol use: No  . Drug use: No  . Sexual activity: Not Currently  Lifestyle  . Physical activity:    Days per week: Not on file    Minutes per session: Not on file  . Stress: Not on file  Relationships  . Social connections:    Talks on phone: Not on file    Gets together: Not on file    Attends religious service: Not on file    Active member of club or organization: Not on file    Attends meetings of clubs or organizations: Not on file    Relationship status: Not on file  . Intimate partner violence:    Fear of current or ex partner: Not on file    Emotionally  abused: Not on file    Physically abused: Not on file    Forced sexual activity: Not on file  Other Topics Concern  . Not on file  Social History Narrative   Widowed.   Retired, once worked in Physiological scientistoffice administrator.   Enjoys crocheting, making cards.      Past Surgical History:  Procedure Laterality Date  . CHOLECYSTECTOMY  2000  . TONSILLECTOMY      Family History  Problem Relation Age of Onset  . Hypertension Mother   . Diabetes Mother   . Hypertension Father   . Stroke Father   . COPD Sister   . Cancer Sister   . Hypertension Sister   . Hypertension Sister     Allergies  Allergen Reactions  . Amlodipine     Ankle edema, shortness of breath, dizziness  . Hctz [Hydrochlorothiazide] Other (See Comments)    Dry mouth, fatigue  . Lisinopril     Current Outpatient Medications on File Prior to Visit  Medication Sig Dispense Refill  . albuterol (PROVENTIL HFA;VENTOLIN HFA) 108 (90 BASE) MCG/ACT inhaler Inhale 1-2 puffs into the lungs every 6 (six) hours as needed for wheezing. 1 Inhaler 0  . diphenhydrAMINE (BENADRYL) 25 MG tablet Take 25 mg by mouth 2 (two) times daily.     Marland Kitchen. escitalopram (LEXAPRO) 5 MG tablet Take 1 tablet (5 mg total) by mouth daily. For anxiety. 90 tablet 1  . hydrALAZINE (APRESOLINE) 50 MG tablet Take 1 tablet (50 mg total) by mouth 2 (two) times a day. For blood pressure. 60 tablet 0  . losartan (COZAAR) 100 MG tablet TAKE 1 TABLET BY MOUTH EVERY DAY FOR BLOOD PRESSURE 90 tablet 3  . Melatonin 5 MG TABS Take by mouth.    . metoprolol succinate (TOPROL-XL) 50 MG 24 hr tablet TAKE 1 TABLET BY MOUTH DAILY. FOR BLOOD PRESSURE. TAKE WITH OR IMMEDIATELY FOLLOWING A MEAL. 30 tablet 0  . Vitamin D, Ergocalciferol, (DRISDOL) 1.25 MG (50000 UT) CAPS capsule Take 1 capsule by mouth once weekly for 12 weeks. 12 capsule 0   No current facility-administered medications on file prior to visit.     BP (!) 144/92   Pulse 71   Wt 222 lb (100.7 kg)   SpO2 97%    BMI 34.00 kg/m    Objective:   Physical Exam  Constitutional: She is oriented to person, place, and time. She appears well-nourished.  Respiratory: Effort normal.  Neurological: She is alert and oriented to person, place, and time.  Psychiatric: She has a normal mood and affect.           Assessment & Plan:

## 2018-11-24 NOTE — Patient Instructions (Signed)
Continue metoprolol succinate 50 mg, losartan 100 mg, and hydralazine 50 mg at bedtime. Try to add in 25 mg of hydralazine in the morning if possible.  Continue to monitor your blood pressure and notify me if you see readings at or above 140/90.  It was a pleasure to see you today! Mayra Reel, NP-C

## 2018-11-24 NOTE — Assessment & Plan Note (Signed)
Improved with addition of hydralazine but diastolic reading still above goal. Home readings are better. Continue current regimen. Will try to have her add in 25 mg of hydralazine in the morning if possible. She will try this and update.

## 2018-11-25 ENCOUNTER — Other Ambulatory Visit (INDEPENDENT_AMBULATORY_CARE_PROVIDER_SITE_OTHER): Payer: PPO

## 2018-11-25 ENCOUNTER — Other Ambulatory Visit: Payer: Self-pay | Admitting: Primary Care

## 2018-11-25 DIAGNOSIS — E559 Vitamin D deficiency, unspecified: Secondary | ICD-10-CM | POA: Diagnosis not present

## 2018-11-25 LAB — VITAMIN D 25 HYDROXY (VIT D DEFICIENCY, FRACTURES): VITD: 25.22 ng/mL — ABNORMAL LOW (ref 30.00–100.00)

## 2018-11-25 MED ORDER — VITAMIN D (ERGOCALCIFEROL) 1.25 MG (50000 UNIT) PO CAPS: ORAL_CAPSULE | ORAL | 1 refills | Status: DC

## 2018-11-27 ENCOUNTER — Other Ambulatory Visit: Payer: Self-pay | Admitting: Primary Care

## 2018-11-27 DIAGNOSIS — I10 Essential (primary) hypertension: Secondary | ICD-10-CM

## 2018-12-12 ENCOUNTER — Other Ambulatory Visit: Payer: Self-pay | Admitting: Primary Care

## 2018-12-12 DIAGNOSIS — I10 Essential (primary) hypertension: Secondary | ICD-10-CM

## 2018-12-13 MED ORDER — HYDRALAZINE HCL 50 MG PO TABS: ORAL_TABLET | ORAL | 0 refills | Status: DC

## 2018-12-27 ENCOUNTER — Other Ambulatory Visit: Payer: Self-pay | Admitting: Primary Care

## 2018-12-27 DIAGNOSIS — I10 Essential (primary) hypertension: Secondary | ICD-10-CM

## 2018-12-29 ENCOUNTER — Ambulatory Visit: Payer: PPO | Admitting: Primary Care

## 2018-12-30 DIAGNOSIS — I10 Essential (primary) hypertension: Secondary | ICD-10-CM

## 2018-12-31 ENCOUNTER — Other Ambulatory Visit: Payer: Self-pay | Admitting: Primary Care

## 2018-12-31 DIAGNOSIS — I10 Essential (primary) hypertension: Secondary | ICD-10-CM

## 2019-01-01 NOTE — Telephone Encounter (Signed)
Rx was sent with "no print" on 12/13/18 in error, Rx resent with Kate's directions and quantity

## 2019-01-06 ENCOUNTER — Other Ambulatory Visit: Payer: Self-pay | Admitting: Primary Care

## 2019-01-06 DIAGNOSIS — I10 Essential (primary) hypertension: Secondary | ICD-10-CM

## 2019-03-17 DIAGNOSIS — I10 Essential (primary) hypertension: Secondary | ICD-10-CM

## 2019-03-17 MED ORDER — HYDRALAZINE HCL 50 MG PO TABS: 50.0000 mg | ORAL_TABLET | Freq: Two times a day (BID) | ORAL | 0 refills | Status: DC

## 2019-03-25 ENCOUNTER — Other Ambulatory Visit: Payer: Self-pay | Admitting: Primary Care

## 2019-03-25 DIAGNOSIS — I10 Essential (primary) hypertension: Secondary | ICD-10-CM

## 2019-04-07 ENCOUNTER — Other Ambulatory Visit: Payer: Self-pay | Admitting: Primary Care

## 2019-04-07 DIAGNOSIS — I10 Essential (primary) hypertension: Secondary | ICD-10-CM

## 2019-05-04 ENCOUNTER — Other Ambulatory Visit: Payer: Self-pay | Admitting: Primary Care

## 2019-05-04 DIAGNOSIS — E559 Vitamin D deficiency, unspecified: Secondary | ICD-10-CM

## 2019-06-01 ENCOUNTER — Other Ambulatory Visit: Payer: Self-pay

## 2019-06-17 ENCOUNTER — Telehealth: Payer: Self-pay | Admitting: Primary Care

## 2019-06-17 DIAGNOSIS — I10 Essential (primary) hypertension: Secondary | ICD-10-CM

## 2019-06-17 MED ORDER — HYDRALAZINE HCL 50 MG PO TABS: ORAL_TABLET | ORAL | 1 refills | Status: DC

## 2019-06-17 NOTE — Telephone Encounter (Signed)
Please take 1/2 pill in am and 1 pill in pm off list

## 2019-06-17 NOTE — Telephone Encounter (Signed)
Pt called stating wrong rx was called in.  She takes 1 pill in am and 1 pill in pm  Pt has 1 day left  Best number [phone]

## 2019-06-17 NOTE — Telephone Encounter (Signed)
Noted. Sent the correct Rx to the pharmacy.

## 2019-06-17 NOTE — Addendum Note (Signed)
Addended by: Jacqualin Combes on: 06/17/2019 04:02 PM   Modules accepted: Orders

## 2019-06-29 ENCOUNTER — Telehealth: Payer: Self-pay

## 2019-06-29 NOTE — Telephone Encounter (Signed)
Noted and agree. 

## 2019-06-29 NOTE — Telephone Encounter (Signed)
Pt says she has been really careful and not been around many people or out anywhere. She is having a "cold" and has lost taste and smell. Asked what to do. I advised her she needs to be tested for Covid. Gave her the info for texting or logging in online to schedule her appointment for testing.

## 2019-06-30 ENCOUNTER — Ambulatory Visit: Payer: PPO | Attending: Internal Medicine

## 2019-06-30 DIAGNOSIS — Z20828 Contact with and (suspected) exposure to other viral communicable diseases: Secondary | ICD-10-CM | POA: Diagnosis not present

## 2019-06-30 DIAGNOSIS — Z20822 Contact with and (suspected) exposure to covid-19: Secondary | ICD-10-CM

## 2019-07-01 ENCOUNTER — Other Ambulatory Visit: Payer: PPO

## 2019-07-01 LAB — NOVEL CORONAVIRUS, NAA: SARS-CoV-2, NAA: DETECTED — AB

## 2019-07-02 ENCOUNTER — Encounter: Payer: Self-pay | Admitting: Infectious Diseases

## 2019-07-02 ENCOUNTER — Telehealth: Payer: Self-pay | Admitting: Primary Care

## 2019-07-02 NOTE — Telephone Encounter (Signed)
Please have her review CDC guidelines. Quarantine 10 days from symptom onset. Must have a significant improvement in symptoms and be fever free for 3 consecutive days without fever reducing medication in order to come out of quarantine after 10 days.

## 2019-07-02 NOTE — Telephone Encounter (Signed)
Patient wants to know why she can't take Ibuprofen with Covid.  Patient said that's all she's been taking Ibuprofen since she started feeling sick.

## 2019-07-02 NOTE — Telephone Encounter (Signed)
Yes, I spoken with patient and ended the call to get another patient.  Spoken and notified patient of Jennifer Cannon comments. Patient verbalized understanding.

## 2019-07-02 NOTE — Telephone Encounter (Signed)
Jennifer Cannon, did you already speak with patient via phone? Studies are conflicting with Ibuprofen, some people have no problems so if she's doing well then okay to take Ibuprofen.

## 2019-07-02 NOTE — Telephone Encounter (Signed)
Patient received her covid results last night and it was detected.  She would like to know what she should do from here.  Advised the patient she should be in quarantined and anyone that is in the house with her or has recently be in contact with her needed to be also. Advised I would send a message to her provider for further information on what the next step should be   She stated she is not having any sick symptoms and feels fine.  She is just having the loss of taste and smell that started on Monday  She would like to know how long she should quarantine.  Patient's stated that her Son and family have already scheduled to be tested today since they have been in contact with her.

## 2019-07-23 ENCOUNTER — Other Ambulatory Visit: Payer: Self-pay | Admitting: Primary Care

## 2019-07-23 DIAGNOSIS — E559 Vitamin D deficiency, unspecified: Secondary | ICD-10-CM

## 2019-08-17 ENCOUNTER — Other Ambulatory Visit: Payer: Self-pay | Admitting: Primary Care

## 2019-08-17 DIAGNOSIS — I10 Essential (primary) hypertension: Secondary | ICD-10-CM

## 2019-08-17 DIAGNOSIS — R7303 Prediabetes: Secondary | ICD-10-CM

## 2019-08-17 DIAGNOSIS — E559 Vitamin D deficiency, unspecified: Secondary | ICD-10-CM

## 2019-08-26 ENCOUNTER — Ambulatory Visit (INDEPENDENT_AMBULATORY_CARE_PROVIDER_SITE_OTHER): Payer: PPO

## 2019-08-26 ENCOUNTER — Other Ambulatory Visit: Payer: Self-pay

## 2019-08-26 ENCOUNTER — Other Ambulatory Visit (INDEPENDENT_AMBULATORY_CARE_PROVIDER_SITE_OTHER): Payer: PPO

## 2019-08-26 DIAGNOSIS — E559 Vitamin D deficiency, unspecified: Secondary | ICD-10-CM

## 2019-08-26 DIAGNOSIS — R7303 Prediabetes: Secondary | ICD-10-CM

## 2019-08-26 DIAGNOSIS — Z0000 Encounter for general adult medical examination without abnormal findings: Secondary | ICD-10-CM

## 2019-08-26 DIAGNOSIS — I10 Essential (primary) hypertension: Secondary | ICD-10-CM

## 2019-08-26 LAB — HEMOGLOBIN A1C: Hgb A1c MFr Bld: 6.4 % (ref 4.6–6.5)

## 2019-08-26 LAB — LIPID PANEL
Cholesterol: 154 mg/dL (ref 0–200)
HDL: 40.7 mg/dL (ref >39.00)
LDL Cholesterol: 97 mg/dL (ref 0–99)
NonHDL: 113.49
Total CHOL/HDL Ratio: 4
Triglycerides: 83 mg/dL (ref 0.0–149.0)
VLDL: 16.6 mg/dL (ref 0.0–40.0)

## 2019-08-26 LAB — COMPREHENSIVE METABOLIC PANEL
ALT: 14 U/L (ref 0–35)
AST: 15 U/L (ref 0–37)
Albumin: 4.2 g/dL (ref 3.5–5.2)
Alkaline Phosphatase: 73 U/L (ref 39–117)
BUN: 9 mg/dL (ref 6–23)
CO2: 30 mEq/L (ref 19–32)
Calcium: 9.6 mg/dL (ref 8.4–10.5)
Chloride: 99 mEq/L (ref 96–112)
Creatinine, Ser: 1.03 mg/dL (ref 0.40–1.20)
GFR: 53.23 mL/min — ABNORMAL LOW (ref >60.00)
Glucose, Bld: 111 mg/dL — ABNORMAL HIGH (ref 70–99)
Potassium: 5 mEq/L (ref 3.5–5.1)
Sodium: 134 mEq/L — ABNORMAL LOW (ref 135–145)
Total Bilirubin: 0.9 mg/dL (ref 0.2–1.2)
Total Protein: 7 g/dL (ref 6.0–8.3)

## 2019-08-26 LAB — CBC
HCT: 42.6 % (ref 36.0–46.0)
Hemoglobin: 14.3 g/dL (ref 12.0–15.0)
MCHC: 33.6 g/dL (ref 30.0–36.0)
MCV: 89.4 fl (ref 78.0–100.0)
Platelets: 208 10*3/uL (ref 150.0–400.0)
RBC: 4.76 Mil/uL (ref 3.87–5.11)
RDW: 14.3 % (ref 11.5–15.5)
WBC: 6.5 10*3/uL (ref 4.0–10.5)

## 2019-08-26 LAB — VITAMIN D 25 HYDROXY (VIT D DEFICIENCY, FRACTURES): VITD: 33.4 ng/mL (ref 30.00–100.00)

## 2019-08-26 NOTE — Patient Instructions (Signed)
Jennifer Cannon , Thank you for taking time to come for your Medicare Wellness Visit. I appreciate your ongoing commitment to your health goals. Please review the following plan we discussed and let me know if I can assist you in the future.   Screening recommendations/referrals: Colonoscopy: declined Mammogram: declined Bone Density: declined Recommended yearly ophthalmology/optometry visit for glaucoma screening and checkup Recommended yearly dental visit for hygiene and checkup  Vaccinations: Influenza vaccine: declined Pneumococcal vaccine: declined Tdap vaccine: decline Shingles vaccine: discussed    Advanced directives: Please bring a copy of your POA (Power of Attorney) and/or Living Will to your next appointment.   Conditions/risks identified: hypertension  Next appointment: 09/02/2019 @ 2:20 pm    Preventive Care 65 Years and Older, Female Preventive care refers to lifestyle choices and visits with your health care provider that can promote health and wellness. What does preventive care include?  A yearly physical exam. This is also called an annual well check.  Dental exams once or twice a year.  Routine eye exams. Ask your health care provider how often you should have your eyes checked.  Personal lifestyle choices, including:  Daily care of your teeth and gums.  Regular physical activity.  Eating a healthy diet.  Avoiding tobacco and drug use.  Limiting alcohol use.  Practicing safe sex.  Taking low-dose aspirin every day.  Taking vitamin and mineral supplements as recommended by your health care provider. What happens during an annual well check? The services and screenings done by your health care provider during your annual well check will depend on your age, overall health, lifestyle risk factors, and family history of disease. Counseling  Your health care provider may ask you questions about your:  Alcohol use.  Tobacco use.  Drug  use.  Emotional well-being.  Home and relationship well-being.  Sexual activity.  Eating habits.  History of falls.  Memory and ability to understand (cognition).  Work and work Astronomer.  Reproductive health. Screening  You may have the following tests or measurements:  Height, weight, and BMI.  Blood pressure.  Lipid and cholesterol levels. These may be checked every 5 years, or more frequently if you are over 2 years old.  Skin check.  Lung cancer screening. You may have this screening every year starting at age 18 if you have a 30-pack-year history of smoking and currently smoke or have quit within the past 15 years.  Fecal occult blood test (FOBT) of the stool. You may have this test every year starting at age 2.  Flexible sigmoidoscopy or colonoscopy. You may have a sigmoidoscopy every 5 years or a colonoscopy every 10 years starting at age 48.  Hepatitis C blood test.  Hepatitis B blood test.  Sexually transmitted disease (STD) testing.  Diabetes screening. This is done by checking your blood sugar (glucose) after you have not eaten for a while (fasting). You may have this done every 1-3 years.  Bone density scan. This is done to screen for osteoporosis. You may have this done starting at age 3.  Mammogram. This may be done every 1-2 years. Talk to your health care provider about how often you should have regular mammograms. Talk with your health care provider about your test results, treatment options, and if necessary, the need for more tests. Vaccines  Your health care provider may recommend certain vaccines, such as:  Influenza vaccine. This is recommended every year.  Tetanus, diphtheria, and acellular pertussis (Tdap, Td) vaccine. You may need a Td  booster every 10 years.  Zoster vaccine. You may need this after age 4.  Pneumococcal 13-valent conjugate (PCV13) vaccine. One dose is recommended after age 66.  Pneumococcal polysaccharide  (PPSV23) vaccine. One dose is recommended after age 17. Talk to your health care provider about which screenings and vaccines you need and how often you need them. This information is not intended to replace advice given to you by your health care provider. Make sure you discuss any questions you have with your health care provider. Document Released: 07/22/2015 Document Revised: 03/14/2016 Document Reviewed: 04/26/2015 Elsevier Interactive Patient Education  2017 [place] Prevention in the Home Falls can cause injuries. They can happen to people of all ages. There are many things you can do to make your home safe and to help prevent falls. What can I do on the outside of my home?  Regularly fix the edges of walkways and driveways and fix any cracks.  Remove anything that might make you trip as you walk through a door, such as a raised step or threshold.  Trim any bushes or trees on the path to your home.  Use bright outdoor lighting.  Clear any walking paths of anything that might make someone trip, such as rocks or tools.  Regularly check to see if handrails are loose or broken. Make sure that both sides of any steps have handrails.  Any raised decks and porches should have guardrails on the edges.  Have any leaves, snow, or ice cleared regularly.  Use sand or salt on walking paths during winter.  Clean up any spills in your garage right away. This includes oil or grease spills. What can I do in the bathroom?  Use night lights.  Install grab bars by the toilet and in the tub and shower. Do not use towel bars as grab bars.  Use non-skid mats or decals in the tub or shower.  If you need to sit down in the shower, use a plastic, non-slip stool.  Keep the floor dry. Clean up any water that spills on the floor as soon as it happens.  Remove soap buildup in the tub or shower regularly.  Attach bath mats securely with double-sided non-slip rug tape.  Do not have  throw rugs and other things on the floor that can make you trip. What can I do in the bedroom?  Use night lights.  Make sure that you have a light by your bed that is easy to reach.  Do not use any sheets or blankets that are too big for your bed. They should not hang down onto the floor.  Have a firm chair that has side arms. You can use this for support while you get dressed.  Do not have throw rugs and other things on the floor that can make you trip. What can I do in the kitchen?  Clean up any spills right away.  Avoid walking on wet floors.  Keep items that you use a lot in easy-to-reach places.  If you need to reach something above you, use a strong step stool that has a grab bar.  Keep electrical cords out of the way.  Do not use floor polish or wax that makes floors slippery. If you must use wax, use non-skid floor wax.  Do not have throw rugs and other things on the floor that can make you trip. What can I do with my stairs?  Do not leave any items on the stairs.  Make sure that there are handrails on both sides of the stairs and use them. Fix handrails that are broken or loose. Make sure that handrails are as long as the stairways.  Check any carpeting to make sure that it is firmly attached to the stairs. Fix any carpet that is loose or worn.  Avoid having throw rugs at the top or bottom of the stairs. If you do have throw rugs, attach them to the floor with carpet tape.  Make sure that you have a light switch at the top of the stairs and the bottom of the stairs. If you do not have them, ask someone to add them for you. What else can I do to help prevent falls?  Wear shoes that:  Do not have high heels.  Have rubber bottoms.  Are comfortable and fit you well.  Are closed at the toe. Do not wear sandals.  If you use a stepladder:  Make sure that it is fully opened. Do not climb a closed stepladder.  Make sure that both sides of the stepladder are  locked into place.  Ask someone to hold it for you, if possible.  Clearly mark and make sure that you can see:  Any grab bars or handrails.  First and last steps.  Where the edge of each step is.  Use tools that help you move around (mobility aids) if they are needed. These include:  Canes.  Walkers.  Scooters.  Crutches.  Turn on the lights when you go into a dark area. Replace any light bulbs as soon as they burn out.  Set up your furniture so you have a clear path. Avoid moving your furniture around.  If any of your floors are uneven, fix them.  If there are any pets around you, be aware of where they are.  Review your medicines with your doctor. Some medicines can make you feel dizzy. This can increase your chance of falling. Ask your doctor what other things that you can do to help prevent falls. This information is not intended to replace advice given to you by your health care provider. Make sure you discuss any questions you have with your health care provider. Document Released: 04/21/2009 Document Revised: 12/01/2015 Document Reviewed: 07/30/2014 Elsevier Interactive Patient Education  2017 [name].

## 2019-08-26 NOTE — Progress Notes (Signed)
Subjective:   KYNADEE DAM is a 69 y.o. female who presents for an Initial Medicare Annual Wellness Visit.  Review of Systems: N/A     This visit is being conducted through telemedicine via telephone at the nurse health advisor's home address due to the COVID-19 pandemic. This patient has given me verbal consent via doximity to conduct this visit, patient states they are participating from their home address. Patient and myself are on the telephone call. There is no referral for this visit. Some vital signs may be absent or patient reported.    Patient identification: identified by name, DOB, and current address    Cardiac Risk Factors include: advanced age (>93men, >31 women);hypertension     Objective:    Today's Vitals   There is no height or weight on file to calculate BMI.  Advanced Directives 08/26/2019  Does Patient Have a Medical Advance Directive? Yes  Type of Estate agent of [place];Living will  Copy of Healthcare Power of Attorney in Chart? No - copy requested    Current Medications (verified) Outpatient Encounter Medications as of 08/26/2019  Medication Sig  . albuterol (PROVENTIL HFA;VENTOLIN HFA) 108 (90 BASE) MCG/ACT inhaler Inhale 1-2 puffs into the lungs every 6 (six) hours as needed for wheezing.  . diphenhydrAMINE (BENADRYL) 25 MG tablet Take 25 mg by mouth 2 (two) times daily.   . hydrALAZINE (APRESOLINE) 50 MG tablet TAKE 1TABLET IN THE MORNING AND 1 TABLET AT BEDTIME FOR BLOOD PRESSURE  . losartan (COZAAR) 100 MG tablet TAKE 1 TABLET BY MOUTH EVERY DAY FOR BLOOD PRESSURE  . Melatonin 5 MG TABS Take by mouth.  . metoprolol succinate (TOPROL-XL) 50 MG 24 hr tablet TAKE 1 TABLET BY MOUTH DAILY FOR BLOOD PRESSURE. TAKE WITH OR IMMEDIATELY FOLLOWING A MEAL  . Vitamin D, Ergocalciferol, (DRISDOL) 1.25 MG (50000 UT) CAPS capsule TAKE 1 CAPSULE BY MOUTH ONCE WEEKLY  . escitalopram (LEXAPRO) 5 MG tablet Take 1 tablet (5 mg total) by mouth  daily. For anxiety. (Patient not taking: Reported on 08/26/2019)   No facility-administered encounter medications on file as of 08/26/2019.    Allergies (verified) Amlodipine, Hctz [hydrochlorothiazide], and Lisinopril   History: Past Medical History:  Diagnosis Date  . Asthma   . Essential hypertension   . GAD (generalized anxiety disorder)   . Impacted cerumen of right ear 03/13/2018   Past Surgical History:  Procedure Laterality Date  . CHOLECYSTECTOMY  2000  . TONSILLECTOMY     Family History  Problem Relation Age of Onset  . Hypertension Mother   . Diabetes Mother   . Hypertension Father   . Stroke Father   . COPD Sister   . Cancer Sister   . Hypertension Sister   . Hypertension Sister    Social History   Socioeconomic History  . Marital status: Widowed    Spouse name: Not on file  . Number of children: Not on file  . Years of education: Not on file  . Highest education level: Not on file  Occupational History  . Not on file  Tobacco Use  . Smoking status: Former Smoker    Packs/day: 0.50    Types: Cigarettes  . Smokeless tobacco: Never Used  Substance and Sexual Activity  . Alcohol use: No  . Drug use: No  . Sexual activity: Not Currently  Other Topics Concern  . Not on file  Social History Narrative   Widowed.   Retired, once worked in Physiological scientist.  Enjoys crocheting, making cards.     Social Determinants of Health   Financial Resource Strain: Low Risk   . Difficulty of Paying Living Expenses: Not hard at all  Food Insecurity: No Food Insecurity  . Worried About Programme researcher, broadcasting/film/video in the Last Year: Never true  . Ran Out of Food in the Last Year: Never true  Transportation Needs: No Transportation Needs  . Lack of Transportation (Medical): No  . Lack of Transportation (Non-Medical): No  Physical Activity: Inactive  . Days of Exercise per Week: 0 days  . Minutes of Exercise per Session: 0 min  Stress: No Stress Concern Present  .  Feeling of Stress : Not at all  Social Connections:   . Frequency of Communication with Friends and Family: Not on file  . Frequency of Social Gatherings with Friends and Family: Not on file  . Attends Religious Services: Not on file  . Active Member of Clubs or Organizations: Not on file  . Attends Banker Meetings: Not on file  . Marital Status: Not on file    Tobacco Counseling Counseling given: Not Answered   Clinical Intake:  Pre-visit preparation completed: Yes  Pain : No/denies pain     Nutritional Risks: None Diabetes: No  How often do you need to have someone help you when you read instructions, pamphlets, or other written materials from your doctor or pharmacy?: 1 - Never What is the last grade level you completed in school?: 12th  Interpreter Needed?: No  Information entered by :: CJohnson, LPN   Activities of Daily Living In your present state of health, do you have any difficulty performing the following activities: 08/26/2019  Hearing? N  Vision? N  Difficulty concentrating or making decisions? N  Walking or climbing stairs? N  Dressing or bathing? N  Doing errands, shopping? N  Preparing Food and eating ? N  Using the Toilet? N  In the past six months, have you accidently leaked urine? N  Do you have problems with loss of bowel control? N  Managing your Medications? N  Managing your Finances? N  Housekeeping or managing your Housekeeping? N  Some recent data might be hidden     Immunizations and Health Maintenance  There is no immunization history on file for this patient. There are no preventive care reminders to display for this patient.  Patient Care Team: Doreene Nest, NP as PCP - General (Internal Medicine)  Indicate any recent Medical Services you may have received from other than [facility] providers in the past year (date may be approximate).     Assessment:   This is a routine wellness examination for  [place].  Hearing/Vision screen  Hearing Screening   125Hz  250Hz  500Hz  1000Hz  2000Hz  3000Hz  4000Hz  6000Hz  8000Hz   Right ear:           Left ear:           Vision Screening Comments: Patient gets eye exams every 3-4 years   Dietary issues and exercise activities discussed: Current Exercise Habits: The patient does not participate in regular exercise at present, Exercise limited by: None identified  Goals    . Patient Stated     08/26/2019, I will try to work on losing some weight soon.       Depression Screen PHQ 2/9 Scores 08/26/2019 09/04/2018  PHQ - 2 Score 0 0  PHQ- 9 Score 0 -    Fall Risk Fall Risk  08/26/2019 06/01/2019 12/25/2017  Falls in the past year? 0 0 No  Comment - Emmi Telephone Survey: data to providers prior to load -  Number falls in past yr: 0 - -  Injury with Fall? 0 - -  Risk for fall due to : Medication side effect - -  Follow up Falls evaluation completed;Falls prevention discussed - -    Is the patient's home free of loose throw rugs in walkways, pet beds, electrical cords, etc?   yes      Grab bars in the bathroom? no      Handrails on the stairs?   yes      Adequate lighting?   yes  Timed Get Up and Go Performed: N/A  Cognitive Function: MMSE - Mini Mental State Exam 08/26/2019  Orientation to time 5  Orientation to Place 5  Registration 3  Attention/ Calculation 5  Recall 3  Language- repeat 1       Mini Cog  Mini-Cog screen was completed. Maximum score is 22. A value of 0 denotes this part of the MMSE was not completed or the patient failed this part of the Mini-Cog screening.  Screening Tests Health Maintenance  Topic Date Due  . Hepatitis C Screening  08/25/2020 (Originally 12/02/1950)  . INFLUENZA VACCINE  08/30/2023 (Originally 02/07/2019)  . MAMMOGRAM  08/30/2023 (Originally 04/13/2001)  . DEXA SCAN  08/30/2023 (Originally 04/13/2016)  . COLONOSCOPY  08/30/2023 (Originally 04/13/2001)  . TETANUS/TDAP  08/30/2023 (Originally 04/13/1970)   . PNA vac Low Risk Adult (1 of 2 - PCV13) 08/30/2023 (Originally 04/13/2016)    Qualifies for Shingles Vaccine: Yes  Cancer Screenings: Lung: Low Dose CT Chest recommended if Age 66-80 years, 30 pack-year currently smoking OR have quit w/in 15 years. Patient does not qualify. Breast: Up to date on Mammogram? No, patient declined   Up to date of Bone Density/Dexa? No, patient declined Colorectal: declined  Additional Screenings:  Hepatitis C Screening: declined     Plan:    Patient will try to work on losing some weight.   I have personally reviewed and noted the following in the patient's chart:   . Medical and social history . Use of alcohol, tobacco or illicit drugs  . Current medications and supplements . Functional ability and status . Nutritional status . Physical activity . Advanced directives . List of other physicians . Hospitalizations, surgeries, and ER visits in previous 12 months . Vitals . Screenings to include cognitive, depression, and falls . Referrals and appointments  In addition, I have reviewed and discussed with patient certain preventive protocols, quality metrics, and best practice recommendations. A written personalized care plan for preventive services as well as general preventive health recommendations were provided to patient.     Andrez Grime, LPN   12/30/7626

## 2019-08-26 NOTE — Progress Notes (Signed)
PCP notes:  Health Maintenance: Prevnar 13- declined Flu- declined Tdap- declined Shingrix- declined Colonoscopy- declined Mammogram- declined Dexa- Declined   Abnormal Screenings: none   Patient concerns: Discuss issue with blood pressure medications    Nurse concerns: none   Next PCP appt.: 09/02/2019 @ 2:20 pm

## 2019-09-02 ENCOUNTER — Other Ambulatory Visit: Payer: Self-pay

## 2019-09-02 ENCOUNTER — Ambulatory Visit (INDEPENDENT_AMBULATORY_CARE_PROVIDER_SITE_OTHER): Payer: PPO | Admitting: Primary Care

## 2019-09-02 ENCOUNTER — Encounter: Payer: Self-pay | Admitting: Primary Care

## 2019-09-02 VITALS — BP 144/90 | HR 78 | Temp 96.8°F | Ht 67.75 in | Wt 219.0 lb

## 2019-09-02 DIAGNOSIS — E559 Vitamin D deficiency, unspecified: Secondary | ICD-10-CM | POA: Diagnosis not present

## 2019-09-02 DIAGNOSIS — F411 Generalized anxiety disorder: Secondary | ICD-10-CM

## 2019-09-02 DIAGNOSIS — Z0000 Encounter for general adult medical examination without abnormal findings: Secondary | ICD-10-CM | POA: Insufficient documentation

## 2019-09-02 DIAGNOSIS — Z0001 Encounter for general adult medical examination with abnormal findings: Secondary | ICD-10-CM | POA: Insufficient documentation

## 2019-09-02 DIAGNOSIS — I10 Essential (primary) hypertension: Secondary | ICD-10-CM

## 2019-09-02 DIAGNOSIS — J4520 Mild intermittent asthma, uncomplicated: Secondary | ICD-10-CM | POA: Diagnosis not present

## 2019-09-02 DIAGNOSIS — R7303 Prediabetes: Secondary | ICD-10-CM

## 2019-09-02 NOTE — Assessment & Plan Note (Signed)
Declines all immunizations despite recommendations. Mammogram and bone density overdue, she declines despite recommendations. Colonoscopy due, she declines despite recommendations.  Encouraged a healthy diet and regular exercise. Exam today stable. Labs reviewed.

## 2019-09-02 NOTE — Assessment & Plan Note (Signed)
Stable, no recent flares. No recent use of albuterol.

## 2019-09-02 NOTE — Patient Instructions (Signed)
Please consider a cholesterol lowering medication.  Please consider immunizations as discussed.  It's important to improve your diet by reducing consumption of fast food, fried food, processed snack foods, sugary drinks. Increase consumption of fresh vegetables and fruits, whole grains, water.  Ensure you are drinking 64 ounces of water daily.  Start exercising. You should be getting 150 minutes of moderate intensity exercise weekly.  Monitor your blood pressure at home and notify me if you see readings at or above 140/90 on a consistent basis.  Schedule a lab appointment for 6 months to repeat blood sugar and cholesterol.  It was a pleasure to see you today!   Preventive Care 36 Years and Older, Female Preventive care refers to lifestyle choices and visits with your health care provider that can promote health and wellness. This includes:  A yearly physical exam. This is also called an annual well check.  Regular dental and eye exams.  Immunizations.  Screening for certain conditions.  Healthy lifestyle choices, such as diet and exercise. What can I expect for my preventive care visit? Physical exam Your health care provider will check:  Height and weight. These may be used to calculate body mass index (BMI), which is a measurement that tells if you are at a healthy weight.  Heart rate and blood pressure.  Your skin for abnormal spots. Counseling Your health care provider may ask you questions about:  Alcohol, tobacco, and drug use.  Emotional well-being.  Home and relationship well-being.  Sexual activity.  Eating habits.  History of falls.  Memory and ability to understand (cognition).  Work and work Statistician.  Pregnancy and menstrual history. What immunizations do I need?  Influenza (flu) vaccine  This is recommended every year. Tetanus, diphtheria, and pertussis (Tdap) vaccine  You may need a Td booster every 10 years. Varicella (chickenpox)  vaccine  You may need this vaccine if you have not already been vaccinated. Zoster (shingles) vaccine  You may need this after age 34. Pneumococcal conjugate (PCV13) vaccine  One dose is recommended after age 53. Pneumococcal polysaccharide (PPSV23) vaccine  One dose is recommended after age 54. Measles, mumps, and rubella (MMR) vaccine  You may need at least one dose of MMR if you were born in 1957 or later. You may also need a second dose. Meningococcal conjugate (MenACWY) vaccine  You may need this if you have certain conditions. Hepatitis A vaccine  You may need this if you have certain conditions or if you travel or work in places where you may be exposed to hepatitis A. Hepatitis B vaccine  You may need this if you have certain conditions or if you travel or work in places where you may be exposed to hepatitis B. Haemophilus influenzae type b (Hib) vaccine  You may need this if you have certain conditions. You may receive vaccines as individual doses or as more than one vaccine together in one shot (combination vaccines). Talk with your health care provider about the risks and benefits of combination vaccines. What tests do I need? Blood tests  Lipid and cholesterol levels. These may be checked every 5 years, or more frequently depending on your overall health.  Hepatitis C test.  Hepatitis B test. Screening  Lung cancer screening. You may have this screening every year starting at age 78 if you have a 30-pack-year history of smoking and currently smoke or have quit within the past 15 years.  Colorectal cancer screening. All adults should have this screening starting at  age 48 and continuing until age 53. Your health care provider may recommend screening at age 62 if you are at increased risk. You will have tests every 1-10 years, depending on your results and the type of screening test.  Diabetes screening. This is done by checking your blood sugar (glucose) after you  have not eaten for a while (fasting). You may have this done every 1-3 years.  Mammogram. This may be done every 1-2 years. Talk with your health care provider about how often you should have regular mammograms.  BRCA-related cancer screening. This may be done if you have a family history of breast, ovarian, tubal, or peritoneal cancers. Other tests  Sexually transmitted disease (STD) testing.  Bone density scan. This is done to screen for osteoporosis. You may have this done starting at age 37. Follow these instructions at home: Eating and drinking  Eat a diet that includes fresh fruits and vegetables, whole grains, lean protein, and low-fat dairy products. Limit your intake of foods with high amounts of sugar, saturated fats, and salt.  Take vitamin and mineral supplements as recommended by your health care provider.  Do not drink alcohol if your health care provider tells you not to drink.  If you drink alcohol: ? Limit how much you have to 0-1 drink a day. ? Be aware of how much alcohol is in your drink. In the [place] one drink equals one 12 oz bottle of beer (355 mL), one 5 oz glass of wine (148 mL), or one 1 oz glass of hard liquor (44 mL). Lifestyle  Take daily care of your teeth and gums.  Stay active. Exercise for at least 30 minutes on 5 or more days each week.  Do not use any products that contain nicotine or tobacco, such as cigarettes, e-cigarettes, and chewing tobacco. If you need help quitting, ask your health care provider.  If you are sexually active, practice safe sex. Use a condom or other form of protection in order to prevent STIs (sexually transmitted infections).  Talk with your health care provider about taking a low-dose aspirin or statin. What's next?  Go to your health care provider once a year for a well check visit.  Ask your health care provider how often you should have your eyes and teeth checked.  Stay up to date on all vaccines. This  information is not intended to replace advice given to you by your health care provider. Make sure you discuss any questions you have with your health care provider. Document Revised: 06/19/2018 Document Reviewed: 06/19/2018 Elsevier Patient Education  2020 [name].

## 2019-09-02 NOTE — Assessment & Plan Note (Signed)
Very close to a diagnosis of diabetes with A1C of 6.4. she admits to an unhealthy diet, is not exercising.  She will work on diet and exercise, repeat A1C in 6 months.

## 2019-09-02 NOTE — Progress Notes (Signed)
Subjective:    Patient ID: Jennifer Cannon, female    DOB: 1951/01/05, 69 y.o.   MRN: [id number]  HPI  This visit occurred during the SARS-CoV-2 public health emergency.  Safety protocols were in place, including screening questions prior to the visit, additional usage of staff PPE, and extensive cleaning of exam room while observing appropriate contact time as indicated for disinfecting solutions.   Jennifer Cannon is a 69 year old female who presents today for complete physical.  Immunizations: -Tetanus: Declines  -Influenza: Declines  -Shingles: Declines  -Pneumonia: Declines   Diet: She endorses a fair diet. Exercise: She is not exercising.  Eye exam: Due, scheduled for March 2021 Dental exam: No recent visit.   Mammogram: Declines, she will think about this. Dexa: Declines  Colonoscopy: Completed about 10 years ago. Declines today.  She is not checking her blood pressure regularly as her BP cuff is broken. She denies headaches, dizziness, chest pain.   BP Readings from Last 3 Encounters:  09/02/19 (!) 144/90  11/24/18 (!) 144/92  11/05/18 (!) 154/96   Wt Readings from Last 3 Encounters:  09/02/19 219 lb (99.3 kg)  11/24/18 222 lb (100.7 kg)  09/04/18 220 lb (99.8 kg)   The 10-year ASCVD risk score Denman George DC Jr., et al., 2013) is: 12.6%   Values used to calculate the score:     Age: 66 years     Sex: Female     Is Non-Hispanic African American: No     Diabetic: No     Tobacco smoker: No     Systolic Blood Pressure: 144 mmHg     Is BP treated: Yes     HDL Cholesterol: 40.7 mg/dL     Total Cholesterol: 154 mg/dL   Review of Systems  Constitutional: Negative for unexpected weight change.  HENT: Negative for rhinorrhea.   Respiratory: Negative for cough and shortness of breath.   Cardiovascular: Negative for chest pain.  Gastrointestinal: Negative for constipation and diarrhea.  Genitourinary: Negative for difficulty urinating.  Musculoskeletal: Negative for  arthralgias.  Skin: Negative for rash.  Allergic/Immunologic: Negative for environmental allergies.  Neurological: Negative for dizziness, numbness and headaches.  Psychiatric/Behavioral: The patient is not nervous/anxious.        Doing well off Lexapro for which she stopped months ago.       Past Medical History:  Diagnosis Date  . Asthma   . Essential hypertension   . GAD (generalized anxiety disorder)   . Impacted cerumen of right ear 03/13/2018     Social History   Socioeconomic History  . Marital status: Widowed    Spouse name: Not on file  . Number of children: Not on file  . Years of education: Not on file  . Highest education level: Not on file  Occupational History  . Not on file  Tobacco Use  . Smoking status: Former Smoker    Packs/day: 0.50    Types: Cigarettes  . Smokeless tobacco: Never Used  Substance and Sexual Activity  . Alcohol use: No  . Drug use: No  . Sexual activity: Not Currently  Other Topics Concern  . Not on file  Social History Narrative   Widowed.   Retired, once worked in Physiological scientist.   Enjoys crocheting, making cards.     Social Determinants of Health   Financial Resource Strain: Low Risk   . Difficulty of Paying Living Expenses: Not hard at all  Food Insecurity: No Food Insecurity  . Worried  About Running Out of Food in the Last Year: Never true  . Ran Out of Food in the Last Year: Never true  Transportation Needs: No Transportation Needs  . Lack of Transportation (Medical): No  . Lack of Transportation (Non-Medical): No  Physical Activity: Inactive  . Days of Exercise per Week: 0 days  . Minutes of Exercise per Session: 0 min  Stress: No Stress Concern Present  . Feeling of Stress : Not at all  Social Connections:   . Frequency of Communication with Friends and Family: Not on file  . Frequency of Social Gatherings with Friends and Family: Not on file  . Attends Religious Services: Not on file  . Active Member of  Clubs or Organizations: Not on file  . Attends Archivist Meetings: Not on file  . Marital Status: Not on file  Intimate Partner Violence: Not At Risk  . Fear of Current or Ex-Partner: No  . Emotionally Abused: No  . Physically Abused: No  . Sexually Abused: No    Past Surgical History:  Procedure Laterality Date  . CHOLECYSTECTOMY  2000  . TONSILLECTOMY      Family History  Problem Relation Age of Onset  . Hypertension Mother   . Diabetes Mother   . Hypertension Father   . Stroke Father   . COPD Sister   . Cancer Sister   . Hypertension Sister   . Hypertension Sister     Allergies  Allergen Reactions  . Amlodipine     Ankle edema, shortness of breath, dizziness  . Hctz [Hydrochlorothiazide] Other (See Comments)    Dry mouth, fatigue  . Lisinopril     Current Outpatient Medications on File Prior to Visit  Medication Sig Dispense Refill  . hydrALAZINE (APRESOLINE) 50 MG tablet TAKE 1TABLET IN THE MORNING AND 1 TABLET AT BEDTIME FOR BLOOD PRESSURE 180 tablet 1  . losartan (COZAAR) 100 MG tablet TAKE 1 TABLET BY MOUTH EVERY DAY FOR BLOOD PRESSURE 90 tablet 3  . metoprolol succinate (TOPROL-XL) 50 MG 24 hr tablet TAKE 1 TABLET BY MOUTH DAILY FOR BLOOD PRESSURE. TAKE WITH OR IMMEDIATELY FOLLOWING A MEAL 90 tablet 1  . albuterol (PROVENTIL HFA;VENTOLIN HFA) 108 (90 BASE) MCG/ACT inhaler Inhale 1-2 puffs into the lungs every 6 (six) hours as needed for wheezing. (Patient not taking: Reported on 09/02/2019) 1 Inhaler 0  . Vitamin D, Ergocalciferol, (DRISDOL) 1.25 MG (50000 UT) CAPS capsule TAKE 1 CAPSULE BY MOUTH ONCE WEEKLY (Patient not taking: Reported on 09/02/2019) 12 capsule 0   No current facility-administered medications on file prior to visit.    BP (!) 144/90   Pulse 78   Temp (!) 96.8 F (36 C) (Temporal)   Ht 5' 7.75" (1.721 m)   Wt 219 lb (99.3 kg)   SpO2 98%   BMI 33.55 kg/m    Objective:   Physical Exam  Constitutional: She is oriented to  person, place, and time. She appears well-nourished.  HENT:  Right Ear: Tympanic membrane and ear canal normal.  Left Ear: Tympanic membrane and ear canal normal.  Mouth/Throat: Oropharynx is clear and moist.  Eyes: Pupils are equal, round, and reactive to light. EOM are normal.  Cardiovascular: Normal rate and regular rhythm.  Respiratory: Effort normal and breath sounds normal.  GI: Soft. Bowel sounds are normal. There is no abdominal tenderness.  Musculoskeletal:        General: Normal range of motion.     Cervical back: Neck supple.  Neurological:  She is alert and oriented to person, place, and time. No cranial nerve deficit.  Reflex Scores:      Patellar reflexes are 2+ on the right side and 2+ on the left side. Skin: Skin is warm and dry.  Psychiatric: She has a normal mood and affect.           Assessment & Plan:

## 2019-09-02 NOTE — Assessment & Plan Note (Signed)
Vitamin D level of 33 today, has been off vitamin D for months. Discussed to start 2000 units daily OTC.

## 2019-09-02 NOTE — Assessment & Plan Note (Signed)
Improved and doing well off Lexapro. Continue to monitor.

## 2019-09-02 NOTE — Assessment & Plan Note (Addendum)
Above goal in the office today, home readings have historically been lower. She does feel nervous each time she comes into the office.  She is compliant to her Hydralazine, Losartan, metoprolol. Continue same. CMP reviewed.  She will notify us if she sees readings consistently at or above 140/90.   ASCVD risk score of 12.6%, she declines statin therapy and Zetia despite recommendations. I explained her risk for heart disease/stroke, she verbalized understanding.

## 2019-10-09 ENCOUNTER — Other Ambulatory Visit: Payer: Self-pay | Admitting: Primary Care

## 2019-10-09 DIAGNOSIS — I10 Essential (primary) hypertension: Secondary | ICD-10-CM

## 2019-10-15 ENCOUNTER — Telehealth: Payer: Self-pay | Admitting: Primary Care

## 2019-10-15 NOTE — Telephone Encounter (Signed)
Message left for patient to return my call.  

## 2019-10-15 NOTE — Telephone Encounter (Signed)
Patient is calling today in regards to the covid vaccine., Patient stated she would like to get the vaccine but is not sure which one she should take Patient stated that she had a reaction when she had the flu vaccine done and did not know if you would recommend her getting one of the 3 vaccines. Or which one you think she would do better with.  Please advise

## 2019-10-15 NOTE — Telephone Encounter (Signed)
As long as she didn't have anaphylaxis to the flu vaccine (throat closure, cannot breathe, full body hives) then she should be fine to take the Covid vaccine. If she's sensitive to vaccines in general then perhaps the J&J vaccine would be best, this is only one vaccine compared to 2 with the other two manufactures.

## 2019-10-16 NOTE — Telephone Encounter (Signed)
Spoken and notified patient of Kate Clark's comments. Patient verbalized understanding.  

## 2019-10-25 ENCOUNTER — Other Ambulatory Visit: Payer: Self-pay | Admitting: Primary Care

## 2019-10-25 DIAGNOSIS — I10 Essential (primary) hypertension: Secondary | ICD-10-CM

## 2019-12-06 ENCOUNTER — Other Ambulatory Visit: Payer: Self-pay | Admitting: Primary Care

## 2019-12-06 DIAGNOSIS — I10 Essential (primary) hypertension: Secondary | ICD-10-CM

## 2020-03-03 ENCOUNTER — Ambulatory Visit (INDEPENDENT_AMBULATORY_CARE_PROVIDER_SITE_OTHER): Payer: PPO | Admitting: Primary Care

## 2020-03-03 ENCOUNTER — Encounter: Payer: Self-pay | Admitting: Primary Care

## 2020-03-03 ENCOUNTER — Other Ambulatory Visit: Payer: Self-pay

## 2020-03-03 VITALS — BP 140/84 | HR 82 | Temp 96.1°F | Ht 67.75 in | Wt 209.2 lb

## 2020-03-03 DIAGNOSIS — J4520 Mild intermittent asthma, uncomplicated: Secondary | ICD-10-CM | POA: Diagnosis not present

## 2020-03-03 DIAGNOSIS — I10 Essential (primary) hypertension: Secondary | ICD-10-CM

## 2020-03-03 DIAGNOSIS — R7303 Prediabetes: Secondary | ICD-10-CM

## 2020-03-03 DIAGNOSIS — R0609 Other forms of dyspnea: Secondary | ICD-10-CM

## 2020-03-03 DIAGNOSIS — R0600 Dyspnea, unspecified: Secondary | ICD-10-CM | POA: Diagnosis not present

## 2020-03-03 LAB — CBC
HCT: 41.2 % (ref 36.0–46.0)
Hemoglobin: 14.1 g/dL (ref 12.0–15.0)
MCHC: 34.3 g/dL (ref 30.0–36.0)
MCV: 91.6 fl (ref 78.0–100.0)
Platelets: 243 10*3/uL (ref 150.0–400.0)
RBC: 4.49 Mil/uL (ref 3.87–5.11)
RDW: 14.6 % (ref 11.5–15.5)
WBC: 7 10*3/uL (ref 4.0–10.5)

## 2020-03-03 LAB — HEMOGLOBIN A1C: Hgb A1c MFr Bld: 5.5 % (ref 4.6–6.5)

## 2020-03-03 LAB — BRAIN NATRIURETIC PEPTIDE: Pro B Natriuretic peptide (BNP): 39 pg/mL (ref 0.0–100.0)

## 2020-03-03 MED ORDER — ALBUTEROL SULFATE HFA 108 (90 BASE) MCG/ACT IN AERS: 1.0000 | INHALATION_SPRAY | Freq: Four times a day (QID) | RESPIRATORY_TRACT | 0 refills | Status: DC | PRN

## 2020-03-03 NOTE — Assessment & Plan Note (Addendum)
Occurs with mild exertion. Doesn't appear to have CHF based off of exam.  Could be asthma. Other differentials include deconditioning, anemia. Checking BNP and CBC.  Encouraged to gradually increase activity level. Rx for albuterol provided. She will update.

## 2020-03-03 NOTE — Progress Notes (Signed)
Subjective:    Patient ID: Jennifer Cannon, female    DOB: 05/16/1951, 69 y.o.   MRN: [id number]  HPI  This visit occurred during the SARS-CoV-2 public health emergency.  Safety protocols were in place, including screening questions prior to the visit, additional usage of staff PPE, and extensive cleaning of exam room while observing appropriate contact time as indicated for disinfecting solutions.   Jennifer Cannon is a 69 year old female with a history of hypertension, asthma, GAD, fatigue, prediabetes who presents today to discuss medications.  Currently managed on losartan 100 mg, hydralazine 50 mg BID, metoprolol succinate 50 mg for hypertension.   She would like to come off of hydralazine due to symptoms of shortness of breath. She cannot tolerate HCTZ as it causes dry mouth and fatigue. She cannot tolerate Amlodipine due to symptoms of ankle edema, shortness of breath, dizziness.   She is checking her BP at home which is running 130's/60's-70's. She endorses dyspnea with exertion such as walking to the mailbox, light household chores, thinks it is secondary to hydralazine.  She denies ankle edema, cough, chest pain, dizziness, fevers. She is currently taking her first dose of hydralazine at 10 am and her second dose at dinner time.   She does have a history of asthma, denies wheezing. Does not have an albuterol inhaler on hand.   BP Readings from Last 3 Encounters:  03/03/20 140/84  09/02/19 (!) 144/90  11/24/18 (!) 144/92     Review of Systems  Constitutional: Negative for chills, fatigue and fever.  Eyes: Negative for visual disturbance.  Respiratory: Positive for shortness of breath. Negative for cough and wheezing.   Cardiovascular: Negative for chest pain, palpitations and leg swelling.  Neurological: Negative for dizziness and headaches.       Past Medical History:  Diagnosis Date  . Asthma   . Essential hypertension   . GAD (generalized anxiety disorder)   .  Impacted cerumen of right ear 03/13/2018     Social History   Socioeconomic History  . Marital status: Widowed    Spouse name: Not on file  . Number of children: Not on file  . Years of education: Not on file  . Highest education level: Not on file  Occupational History  . Not on file  Tobacco Use  . Smoking status: Former Smoker    Packs/day: 0.50    Types: Cigarettes  . Smokeless tobacco: Never Used  Substance and Sexual Activity  . Alcohol use: No  . Drug use: No  . Sexual activity: Not Currently  Other Topics Concern  . Not on file  Social History Narrative   Widowed.   Retired, once worked in Physiological scientist.   Enjoys crocheting, making cards.     Social Determinants of Health   Financial Resource Strain: Low Risk   . Difficulty of Paying Living Expenses: Not hard at all  Food Insecurity: No Food Insecurity  . Worried About Programme researcher, broadcasting/film/video in the Last Year: Never true  . Ran Out of Food in the Last Year: Never true  Transportation Needs: No Transportation Needs  . Lack of Transportation (Medical): No  . Lack of Transportation (Non-Medical): No  Physical Activity: Inactive  . Days of Exercise per Week: 0 days  . Minutes of Exercise per Session: 0 min  Stress: No Stress Concern Present  . Feeling of Stress : Not at all  Social Connections:   . Frequency of Communication with Friends and Family:  Not on file  . Frequency of Social Gatherings with Friends and Family: Not on file  . Attends Religious Services: Not on file  . Active Member of Clubs or Organizations: Not on file  . Attends Banker Meetings: Not on file  . Marital Status: Not on file  Intimate Partner Violence: Not At Risk  . Fear of Current or Ex-Partner: No  . Emotionally Abused: No  . Physically Abused: No  . Sexually Abused: No    Past Surgical History:  Procedure Laterality Date  . CHOLECYSTECTOMY  2000  . TONSILLECTOMY      Family History  Problem Relation Age of  Onset  . Hypertension Mother   . Diabetes Mother   . Hypertension Father   . Stroke Father   . COPD Sister   . Cancer Sister   . Hypertension Sister   . Hypertension Sister     Allergies  Allergen Reactions  . Amlodipine     Ankle edema, shortness of breath, dizziness  . Hctz [Hydrochlorothiazide] Other (See Comments)    Dry mouth, fatigue  . Lisinopril     Current Outpatient Medications on File Prior to Visit  Medication Sig Dispense Refill  . hydrALAZINE (APRESOLINE) 50 MG tablet TAKE 1 TABLET BY MOUTH EVERY MORNING AND 1 TABLET AT BEDTIME FOR BLOOD PRESSURE 180 tablet 1  . losartan (COZAAR) 100 MG tablet TAKE 1 TABLET BY MOUTH EVERY DAY FOR BLOOD PRESSURE 90 tablet 3  . metoprolol succinate (TOPROL-XL) 50 MG 24 hr tablet TAKE 1 TABLET BY MOUTH DAILY FOR BLOOD PRESSURE. TAKE WITH OR IMMEDIATELY FOLLOWING A MEAL 90 tablet 1   No current facility-administered medications on file prior to visit.    BP 140/84   Pulse 82   Temp (!) 96.1 F (35.6 C) (Temporal)   Ht 5' 7.75" (1.721 m)   Wt 209 lb 4 oz (94.9 kg)   SpO2 98%   BMI 32.05 kg/m    Objective:   Physical Exam Cardiovascular:     Rate and Rhythm: Normal rate and regular rhythm.     Comments: No lower extremity edema noted Pulmonary:     Effort: Pulmonary effort is normal.     Breath sounds: Normal breath sounds.  Musculoskeletal:     Cervical back: Neck supple.  Skin:    General: Skin is warm and dry.            Assessment & Plan:

## 2020-03-03 NOTE — Assessment & Plan Note (Signed)
Repeat A1C pending.  Discussed the importance of a healthy diet and regular exercise in order for weight loss, and to reduce the risk of any potential medical problems.  

## 2020-03-03 NOTE — Patient Instructions (Signed)
Stop by the lab prior to leaving today. I will notify you of your results once received.   Shortness of Breath/Wheezing/Cough: Use the albuterol inhaler. Inhale 2 puffs into the lungs every 4 to 6 hours as needed for wheezing, cough, and/or shortness of breath.   Move your second dose of hydralazine to bedtime as discussed.  It was a pleasure to see you today!

## 2020-03-03 NOTE — Assessment & Plan Note (Signed)
Controlled in the office today, home readings are lower.  Discussed to try moving her second dose of hydralazine to bedtime.  Continue all medications as prescribed given good control of hypertension.

## 2020-03-03 NOTE — Assessment & Plan Note (Signed)
Could be contributing to exertional dyspnea. Lungs clear on exam.  Rx for albuterol sent to pharmacy.

## 2020-04-07 ENCOUNTER — Other Ambulatory Visit: Payer: Self-pay | Admitting: Primary Care

## 2020-04-07 DIAGNOSIS — I10 Essential (primary) hypertension: Secondary | ICD-10-CM

## 2020-04-13 ENCOUNTER — Other Ambulatory Visit: Payer: Self-pay | Admitting: Primary Care

## 2020-04-13 DIAGNOSIS — J4520 Mild intermittent asthma, uncomplicated: Secondary | ICD-10-CM

## 2020-06-08 ENCOUNTER — Other Ambulatory Visit: Payer: Self-pay | Admitting: Primary Care

## 2020-06-08 DIAGNOSIS — I10 Essential (primary) hypertension: Secondary | ICD-10-CM

## 2020-06-08 NOTE — Telephone Encounter (Signed)
Pharmacy requests refill on: Hydralazine 50 mg   LAST REFILL: 12/08/2019 (Q-180, R-1) LAST OV: 03/03/2020 NEXT OV: Not Scheduled  PHARMACY: [id number] [place], [place]

## 2020-06-08 NOTE — Telephone Encounter (Signed)
Patient wants to check on the status of her medication refill for hydralazine she will be out of it by Friday. EM

## 2020-06-28 ENCOUNTER — Other Ambulatory Visit: Payer: Self-pay | Admitting: Primary Care

## 2020-06-28 DIAGNOSIS — J4520 Mild intermittent asthma, uncomplicated: Secondary | ICD-10-CM

## 2020-08-29 ENCOUNTER — Telehealth: Payer: Self-pay | Admitting: Primary Care

## 2020-08-29 DIAGNOSIS — I10 Essential (primary) hypertension: Secondary | ICD-10-CM

## 2020-08-29 NOTE — Telephone Encounter (Signed)
Patient called in stating that [facility] had told [facility] that they are having trouble getting her Losartan. She will be out of it by Friday the 25th. Please advise. EM The pharmacy was going to be sending over the info to [place].

## 2020-08-29 NOTE — Telephone Encounter (Signed)
Have received fax about this for another patient all losartan is on backorder with no release date.

## 2020-08-30 MED ORDER — OLMESARTAN MEDOXOMIL 20 MG PO TABS: 20.0000 mg | ORAL_TABLET | Freq: Every day | ORAL | 0 refills | Status: DC

## 2020-08-30 NOTE — Telephone Encounter (Signed)
Please notify patient that I will be changing her from losartan to olmesartan 20 mg.  I will send a new prescription to her pharmacy now.  We will still see her this Friday for her CPE and arrange further follow-up thereafter.

## 2020-08-30 NOTE — Addendum Note (Signed)
Addended by: Doreene Nest on: 08/30/2020 04:33 PM   Modules accepted: Orders

## 2020-08-31 ENCOUNTER — Other Ambulatory Visit: Payer: Self-pay

## 2020-08-31 ENCOUNTER — Ambulatory Visit (INDEPENDENT_AMBULATORY_CARE_PROVIDER_SITE_OTHER): Payer: PPO

## 2020-08-31 DIAGNOSIS — Z0000 Encounter for general adult medical examination without abnormal findings: Secondary | ICD-10-CM

## 2020-08-31 NOTE — Patient Instructions (Signed)
Jennifer Cannon , Thank you for taking time to come for your Medicare Wellness Visit. I appreciate your ongoing commitment to your health goals. Please review the following plan we discussed and let me know if I can assist you in the future.   Screening recommendations/referrals: Colonoscopy: declined Mammogram: declined Bone Density: declined Recommended yearly ophthalmology/optometry visit for glaucoma screening and checkup Recommended yearly dental visit for hygiene and checkup  Vaccinations: Influenza vaccine: declined Pneumococcal vaccine: declined Tdap vaccine: declined Shingles vaccine: declined   Covid-19: 2 doses completed, booster due. Please get as soon as possible  Advanced directives: Advance directive discussed with you today. Even though you declined this today please call our office should you change your mind and we can give you the proper paperwork for you to fill out.  Conditions/risks identified: hypertension  Next appointment: Follow up in one year for your annual wellness visit    Preventive Care 65 Years and Older, Female Preventive care refers to lifestyle choices and visits with your health care provider that can promote health and wellness. What does preventive care include?  A yearly physical exam. This is also called an annual well check.  Dental exams once or twice a year.  Routine eye exams. Ask your health care provider how often you should have your eyes checked.  Personal lifestyle choices, including:  Daily care of your teeth and gums.  Regular physical activity.  Eating a healthy diet.  Avoiding tobacco and drug use.  Limiting alcohol use.  Practicing safe sex.  Taking low-dose aspirin every day.  Taking vitamin and mineral supplements as recommended by your health care provider. What happens during an annual well check? The services and screenings done by your health care provider during your annual well check will depend on your  age, overall health, lifestyle risk factors, and family history of disease. Counseling  Your health care provider may ask you questions about your:  Alcohol use.  Tobacco use.  Drug use.  Emotional well-being.  Home and relationship well-being.  Sexual activity.  Eating habits.  History of falls.  Memory and ability to understand (cognition).  Work and work Astronomer.  Reproductive health. Screening  You may have the following tests or measurements:  Height, weight, and BMI.  Blood pressure.  Lipid and cholesterol levels. These may be checked every 5 years, or more frequently if you are over 14 years old.  Skin check.  Lung cancer screening. You may have this screening every year starting at age 76 if you have a 30-pack-year history of smoking and currently smoke or have quit within the past 15 years.  Fecal occult blood test (FOBT) of the stool. You may have this test every year starting at age 65.  Flexible sigmoidoscopy or colonoscopy. You may have a sigmoidoscopy every 5 years or a colonoscopy every 10 years starting at age 69.  Hepatitis C blood test.  Hepatitis B blood test.  Sexually transmitted disease (STD) testing.  Diabetes screening. This is done by checking your blood sugar (glucose) after you have not eaten for a while (fasting). You may have this done every 1-3 years.  Bone density scan. This is done to screen for osteoporosis. You may have this done starting at age 72.  Mammogram. This may be done every 1-2 years. Talk to your health care provider about how often you should have regular mammograms. Talk with your health care provider about your test results, treatment options, and if necessary, the need for more tests.  Vaccines  Your health care provider may recommend certain vaccines, such as:  Influenza vaccine. This is recommended every year.  Tetanus, diphtheria, and acellular pertussis (Tdap, Td) vaccine. You may need a Td booster  every 10 years.  Zoster vaccine. You may need this after age 45.  Pneumococcal 13-valent conjugate (PCV13) vaccine. One dose is recommended after age 84.  Pneumococcal polysaccharide (PPSV23) vaccine. One dose is recommended after age 36. Talk to your health care provider about which screenings and vaccines you need and how often you need them. This information is not intended to replace advice given to you by your health care provider. Make sure you discuss any questions you have with your health care provider. Document Released: 07/22/2015 Document Revised: 03/14/2016 Document Reviewed: 04/26/2015 Elsevier Interactive Patient Education  2017 [employer] Prevention in the Home Falls can cause injuries. They can happen to people of all ages. There are many things you can do to make your home safe and to help prevent falls. What can I do on the outside of my home?  Regularly fix the edges of walkways and driveways and fix any cracks.  Remove anything that might make you trip as you walk through a door, such as a raised step or threshold.  Trim any bushes or trees on the path to your home.  Use bright outdoor lighting.  Clear any walking paths of anything that might make someone trip, such as rocks or tools.  Regularly check to see if handrails are loose or broken. Make sure that both sides of any steps have handrails.  Any raised decks and porches should have guardrails on the edges.  Have any leaves, snow, or ice cleared regularly.  Use sand or salt on walking paths during winter.  Clean up any spills in your garage right away. This includes oil or grease spills. What can I do in the bathroom?  Use night lights.  Install grab bars by the toilet and in the tub and shower. Do not use towel bars as grab bars.  Use non-skid mats or decals in the tub or shower.  If you need to sit down in the shower, use a plastic, non-slip stool.  Keep the floor dry. Clean up any  water that spills on the floor as soon as it happens.  Remove soap buildup in the tub or shower regularly.  Attach bath mats securely with double-sided non-slip rug tape.  Do not have throw rugs and other things on the floor that can make you trip. What can I do in the bedroom?  Use night lights.  Make sure that you have a light by your bed that is easy to reach.  Do not use any sheets or blankets that are too big for your bed. They should not hang down onto the floor.  Have a firm chair that has side arms. You can use this for support while you get dressed.  Do not have throw rugs and other things on the floor that can make you trip. What can I do in the kitchen?  Clean up any spills right away.  Avoid walking on wet floors.  Keep items that you use a lot in easy-to-reach places.  If you need to reach something above you, use a strong step stool that has a grab bar.  Keep electrical cords out of the way.  Do not use floor polish or wax that makes floors slippery. If you must use wax, use non-skid floor wax.  Do not have throw rugs and other things on the floor that can make you trip. What can I do with my stairs?  Do not leave any items on the stairs.  Make sure that there are handrails on both sides of the stairs and use them. Fix handrails that are broken or loose. Make sure that handrails are as long as the stairways.  Check any carpeting to make sure that it is firmly attached to the stairs. Fix any carpet that is loose or worn.  Avoid having throw rugs at the top or bottom of the stairs. If you do have throw rugs, attach them to the floor with carpet tape.  Make sure that you have a light switch at the top of the stairs and the bottom of the stairs. If you do not have them, ask someone to add them for you. What else can I do to help prevent falls?  Wear shoes that:  Do not have high heels.  Have rubber bottoms.  Are comfortable and fit you well.  Are closed  at the toe. Do not wear sandals.  If you use a stepladder:  Make sure that it is fully opened. Do not climb a closed stepladder.  Make sure that both sides of the stepladder are locked into place.  Ask someone to hold it for you, if possible.  Clearly mark and make sure that you can see:  Any grab bars or handrails.  First and last steps.  Where the edge of each step is.  Use tools that help you move around (mobility aids) if they are needed. These include:  Canes.  Walkers.  Scooters.  Crutches.  Turn on the lights when you go into a dark area. Replace any light bulbs as soon as they burn out.  Set up your furniture so you have a clear path. Avoid moving your furniture around.  If any of your floors are uneven, fix them.  If there are any pets around you, be aware of where they are.  Review your medicines with your doctor. Some medicines can make you feel dizzy. This can increase your chance of falling. Ask your doctor what other things that you can do to help prevent falls. This information is not intended to replace advice given to you by your health care provider. Make sure you discuss any questions you have with your health care provider. Document Released: 04/21/2009 Document Revised: 12/01/2015 Document Reviewed: 07/30/2014 Elsevier Interactive Patient Education  2017 [name].

## 2020-08-31 NOTE — Progress Notes (Signed)
Subjective:   Jennifer Cannon is a 70 y.o. female who presents for Medicare Annual (Subsequent) preventive examination.  Review of Systems: N/A      I connected with the patient today by telephone and verified that I am speaking with the correct person using two identifiers. Location patient: home Location nurse: work Persons participating in the telephone visit: patient, nurse.   I discussed the limitations, risks, security and privacy concerns of performing an evaluation and management service by telephone and the availability of in person appointments. I also discussed with the patient that there may be a patient responsible charge related to this service. The patient expressed understanding and verbally consented to this telephonic visit.        Cardiac Risk Factors include: advanced age (>9men, >52 women);hypertension     Objective:    Today's Vitals   There is no height or weight on file to calculate BMI.  Advanced Directives 08/31/2020 08/26/2019  Does Patient Have a Medical Advance Directive? No Yes  Type of Advance Directive - Healthcare Power of [place];Living will  Copy of Healthcare Power of Attorney in Chart? - No - copy requested  Would patient like information on creating a medical advance directive? No - Patient declined -    Current Medications (verified) Outpatient Encounter Medications as of 08/31/2020  Medication Sig  . albuterol (VENTOLIN HFA) 108 (90 Base) MCG/ACT inhaler INHALE 1 TO 2 PUFFS INTO THE LUNGS EVERY 6 HOURS AS NEEDED FOR WHEEZE  . hydrALAZINE (APRESOLINE) 50 MG tablet TAKE 1 TABLET BY MOUTH EVERY MORNING AND 1 TABLET AT BEDTIME FOR BLOOD PRESSURE  . metoprolol succinate (TOPROL-XL) 50 MG 24 hr tablet TAKE 1 TABLET BY MOUTH DAILY FOR BLOOD PRESSURE. TAKE WITH OR IMMEDIATELY FOLLOWING A MEAL  . olmesartan (BENICAR) 20 MG tablet Take 1 tablet (20 mg total) by mouth daily. For blood pressure.   No facility-administered encounter medications on  file as of 08/31/2020.    Allergies (verified) Amlodipine, Hctz [hydrochlorothiazide], and Lisinopril   History: Past Medical History:  Diagnosis Date  . Asthma   . Essential hypertension   . GAD (generalized anxiety disorder)   . Impacted cerumen of right ear 03/13/2018   Past Surgical History:  Procedure Laterality Date  . CHOLECYSTECTOMY  2000  . TONSILLECTOMY     Family History  Problem Relation Age of Onset  . Hypertension Mother   . Diabetes Mother   . Hypertension Father   . Stroke Father   . COPD Sister   . Cancer Sister   . Hypertension Sister   . Hypertension Sister    Social History   Socioeconomic History  . Marital status: Widowed    Spouse name: Not on file  . Number of children: Not on file  . Years of education: Not on file  . Highest education level: Not on file  Occupational History  . Not on file  Tobacco Use  . Smoking status: Former Smoker    Packs/day: 0.50    Types: Cigarettes  . Smokeless tobacco: Never Used  Substance and Sexual Activity  . Alcohol use: No  . Drug use: No  . Sexual activity: Not Currently  Other Topics Concern  . Not on file  Social History Narrative   Widowed.   Retired, once worked in Physiological scientist.   Enjoys crocheting, making cards.     Social Determinants of Health   Financial Resource Strain: Low Risk   . Difficulty of Paying Living Expenses: Not  hard at all  Food Insecurity: No Food Insecurity  . Worried About Programme researcher, broadcasting/film/video in the Last Year: Never true  . Ran Out of Food in the Last Year: Never true  Transportation Needs: No Transportation Needs  . Lack of Transportation (Medical): No  . Lack of Transportation (Non-Medical): No  Physical Activity: Inactive  . Days of Exercise per Week: 0 days  . Minutes of Exercise per Session: 0 min  Stress: No Stress Concern Present  . Feeling of Stress : Not at all  Social Connections: Not on file    Tobacco Counseling Counseling given: Not  Answered   Clinical Intake:  Pre-visit preparation completed: Yes  Pain : No/denies pain     Nutritional Risks: None Diabetes: No  How often do you need to have someone help you when you read instructions, pamphlets, or other written materials from your doctor or pharmacy?: 1 - Never What is the last grade level you completed in school?: 12th  Diabetic: No Nutrition Risk Assessment:  Has the patient had any N/V/D within the last 2 months?  No  Does the patient have any non-healing wounds?  No  Has the patient had any unintentional weight loss or weight gain?  No   Diabetes:  Is the patient diabetic?  No  If diabetic, was a CBG obtained today?  N/A Did the patient bring in their glucometer from home?  N/A How often do you monitor your CBG's? N/A.   Financial Strains and Diabetes Management:  Are you having any financial strains with the device, your supplies or your medication? N/A.  Does the patient want to be seen by Chronic Care Management for management of their diabetes?  N/A Would the patient like to be referred to a Nutritionist or for Diabetic Management?  N/A   Interpreter Needed?: No  Information entered by :: CJohnson, LPN   Activities of Daily Living In your present state of health, do you have any difficulty performing the following activities: 08/31/2020  Hearing? N  Vision? N  Difficulty concentrating or making decisions? N  Walking or climbing stairs? N  Dressing or bathing? N  Doing errands, shopping? N  Preparing Food and eating ? N  Using the Toilet? N  In the past six months, have you accidently leaked urine? N  Do you have problems with loss of bowel control? N  Managing your Medications? N  Managing your Finances? N  Housekeeping or managing your Housekeeping? N  Some recent data might be hidden    Patient Care Team: Doreene Nest, NP as PCP - General (Internal Medicine)  Indicate any recent Medical Services you may have  received from other than [facility] providers in the past year (date may be approximate).     Assessment:   This is a routine wellness examination for [place].  Hearing/Vision screen  Hearing Screening   125Hz  250Hz  500Hz  1000Hz  2000Hz  3000Hz  4000Hz  6000Hz  8000Hz   Right ear:           Left ear:           Vision Screening Comments: Advised patient to get annual eye exams    Dietary issues and exercise activities discussed: Current Exercise Habits: The patient does not participate in regular exercise at present, Exercise limited by: None identified  Goals    . Patient Stated     08/26/2019, I will try to work on losing some weight soon.     . Patient Stated  08/31/2020, I will maintain and continue medications as prescribed.       Depression Screen PHQ 2/9 Scores 08/31/2020 08/26/2019 09/04/2018  PHQ - 2 Score 0 0 0  PHQ- 9 Score 0 0 -    Fall Risk Fall Risk  08/31/2020 08/26/2019 06/01/2019 12/25/2017  Falls in the past year? 0 0 0 No  Comment - - Emmi Telephone Survey: data to providers prior to load -  Number falls in past yr: 0 0 - -  Injury with Fall? 0 0 - -  Risk for fall due to : Medication side effect Medication side effect - -  Follow up Falls evaluation completed;Falls prevention discussed Falls evaluation completed;Falls prevention discussed - -    FALL RISK PREVENTION PERTAINING TO THE HOME:  Any stairs in or around the home? Yes  If so, are there any without handrails? No  Home free of loose throw rugs in walkways, pet beds, electrical cords, etc? Yes  Adequate lighting in your home to reduce risk of falls? Yes   ASSISTIVE DEVICES UTILIZED TO PREVENT FALLS:  Life alert? No  Use of a cane, walker or w/c? No  Grab bars in the bathroom? No  Shower chair or bench in shower? No  Elevated toilet seat or a handicapped toilet? No   TIMED UP AND GO:  Was the test performed? N/A telephone visit .    Cognitive Function: MMSE - Mini Mental State Exam 08/31/2020  08/26/2019  Orientation to time 5 5  Orientation to Place 5 5  Registration 3 3  Attention/ Calculation 5 5  Recall 3 3  Language- repeat 1 1  Mini Cog  Mini-Cog screen was completed. Maximum score is 22. A value of 0 denotes this part of the MMSE was not completed or the patient failed this part of the Mini-Cog screening.       Immunizations Immunization History  Administered Date(s) Administered  . PFIZER(Purple Top)SARS-COV-2 Vaccination 05/06/2020, 05/22/2020    TDAP status: declined  Flu Vaccine status: Declined, Education has been provided regarding the importance of this vaccine but patient still declined. Advised may receive this vaccine at local pharmacy or Health Dept. Aware to provide a copy of the vaccination record if obtained from local pharmacy or Health Dept. Verbalized acceptance and understanding.  Pneumococcal vaccine status: Declined,  Education has been provided regarding the importance of this vaccine but patient still declined. Advised may receive this vaccine at local pharmacy or Health Dept. Aware to provide a copy of the vaccination record if obtained from local pharmacy or Health Dept. Verbalized acceptance and understanding.   Covid-19 vaccine status: 2 doses completed, Booster due. Patient is aware.  Qualifies for Shingles Vaccine? Yes   Zostavax completed No   Shingrix Completed: declined  Screening Tests Health Maintenance  Topic Date Due  . Hepatitis C Screening  Never done  . COVID-19 Vaccine (2 - Pfizer 3-dose series) 06/12/2020  . INFLUENZA VACCINE  08/30/2023 (Originally 02/07/2020)  . MAMMOGRAM  08/30/2023 (Originally 04/13/2001)  . DEXA SCAN  08/30/2023 (Originally 04/13/2016)  . COLONOSCOPY (Pts 45-36yrs Insurance coverage will need to be confirmed)  08/30/2023 (Originally 04/13/1996)  . TETANUS/TDAP  08/30/2023 (Originally 04/13/1970)  . PNA vac Low Risk Adult (1 of 2 - PCV13) 08/30/2023 (Originally 04/13/2016)    Health  Maintenance  Health Maintenance Due  Topic Date Due  . Hepatitis C Screening  Never done  . COVID-19 Vaccine (2 - Pfizer 3-dose series) 06/12/2020    Colorectal cancer screening  declined   Mammogram status: declined  Bone Density status:declined  Lung Cancer Screening: (Low Dose CT Chest recommended if Age 40-80 years, 30 pack-year currently smoking OR have quit w/in 15 years.) does not qualify.    Additional Screening:  Hepatitis C Screening: does qualify; Completed due  Vision Screening: Recommended annual ophthalmology exams for early detection of glaucoma and other disorders of the eye. Is the patient up to date with their annual eye exam?  No, goes when needed Who is the Zakary Kimura or what is the name of the office in which the patient attends annual eye exams? [facility] [address] If pt is not established with a Anoop Hemmer, would they like to be referred to a Shardea Cwynar to establish care? No .   Dental Screening: Recommended annual dental exams for proper oral hygiene  Community Resource Referral / Chronic Care Management: CRR required this visit?  No   CCM required this visit?  No      Plan:     I have personally reviewed and noted the following in the patient's chart:   . Medical and social history . Use of alcohol, tobacco or illicit drugs  . Current medications and supplements . Functional ability and status . Nutritional status . Physical activity . Advanced directives . List of other physicians . Hospitalizations, surgeries, and ER visits in previous 12 months . Vitals . Screenings to include cognitive, depression, and falls . Referrals and appointments  In addition, I have reviewed and discussed with patient certain preventive protocols, quality metrics, and best practice recommendations. A written personalized care plan for preventive services as well as general preventive health recommendations were provided to patient.   Due to this being a  telephonic visit, the after visit summary with patients personalized plan was offered to patient via office or my-chart. Patient preferred to pick up at office at next visit or via mychart.   Janalyn ShyJohnson, [place], LPN   0/98/11912/23/2022

## 2020-08-31 NOTE — Telephone Encounter (Signed)
Called patient reviewed all information and repeated back to me. Will call if any questions.  ? ?

## 2020-08-31 NOTE — Progress Notes (Signed)
PCP notes:  Health Maintenance: Pneumococcal- declined Flu- declined Tdap- declined Covid booster- due Colonoscopy- declined Mammogram- declined Dexa- declined    Abnormal Screenings: none   Patient concerns: Discuss stopping hydralazine- makes her feel bad   Nurse concerns: none   Next PCP appt.: 09/02/2020 @ 10 am

## 2020-09-02 ENCOUNTER — Ambulatory Visit (INDEPENDENT_AMBULATORY_CARE_PROVIDER_SITE_OTHER): Payer: PPO | Admitting: Primary Care

## 2020-09-02 ENCOUNTER — Other Ambulatory Visit: Payer: Self-pay

## 2020-09-02 VITALS — BP 128/78 | HR 83 | Temp 97.2°F | Ht 68.0 in | Wt 209.2 lb

## 2020-09-02 DIAGNOSIS — Z0000 Encounter for general adult medical examination without abnormal findings: Secondary | ICD-10-CM | POA: Diagnosis not present

## 2020-09-02 DIAGNOSIS — R0600 Dyspnea, unspecified: Secondary | ICD-10-CM | POA: Diagnosis not present

## 2020-09-02 DIAGNOSIS — F411 Generalized anxiety disorder: Secondary | ICD-10-CM

## 2020-09-02 DIAGNOSIS — R7303 Prediabetes: Secondary | ICD-10-CM | POA: Diagnosis not present

## 2020-09-02 DIAGNOSIS — R0609 Other forms of dyspnea: Secondary | ICD-10-CM

## 2020-09-02 DIAGNOSIS — E559 Vitamin D deficiency, unspecified: Secondary | ICD-10-CM | POA: Diagnosis not present

## 2020-09-02 DIAGNOSIS — J4520 Mild intermittent asthma, uncomplicated: Secondary | ICD-10-CM

## 2020-09-02 DIAGNOSIS — I10 Essential (primary) hypertension: Secondary | ICD-10-CM

## 2020-09-02 LAB — COMPREHENSIVE METABOLIC PANEL
ALT: 15 U/L (ref 0–35)
AST: 14 U/L (ref 0–37)
Albumin: 4.1 g/dL (ref 3.5–5.2)
Alkaline Phosphatase: 80 U/L (ref 39–117)
BUN: 10 mg/dL (ref 6–23)
CO2: 26 mEq/L (ref 19–32)
Calcium: 9.6 mg/dL (ref 8.4–10.5)
Chloride: 98 mEq/L (ref 96–112)
Creatinine, Ser: 1.02 mg/dL (ref 0.40–1.20)
GFR: 56.18 mL/min — ABNORMAL LOW (ref >60.00)
Glucose, Bld: 110 mg/dL — ABNORMAL HIGH (ref 70–99)
Potassium: 4.7 mEq/L (ref 3.5–5.1)
Sodium: 133 mEq/L — ABNORMAL LOW (ref 135–145)
Total Bilirubin: 0.9 mg/dL (ref 0.2–1.2)
Total Protein: 7.3 g/dL (ref 6.0–8.3)

## 2020-09-02 LAB — LIPID PANEL
Cholesterol: 163 mg/dL (ref 0–200)
HDL: 49.9 mg/dL (ref >39.00)
LDL Cholesterol: 99 mg/dL (ref 0–99)
NonHDL: 112.9
Total CHOL/HDL Ratio: 3
Triglycerides: 70 mg/dL (ref 0.0–149.0)
VLDL: 14 mg/dL (ref 0.0–40.0)

## 2020-09-02 LAB — VITAMIN D 25 HYDROXY (VIT D DEFICIENCY, FRACTURES): VITD: 32.02 ng/mL (ref 30.00–100.00)

## 2020-09-02 LAB — HEMOGLOBIN A1C: Hgb A1c MFr Bld: 5.6 % (ref 4.6–6.5)

## 2020-09-02 MED ORDER — HYDRALAZINE HCL 50 MG PO TABS: 50.0000 mg | ORAL_TABLET | Freq: Every evening | ORAL | 1 refills | Status: DC

## 2020-09-02 NOTE — Assessment & Plan Note (Signed)
Compliant to 2000 IU once daily, continue same.

## 2020-09-02 NOTE — Assessment & Plan Note (Signed)
Chronic, stable. She is deconditioned as she hardly moves during the day.   Recommended to increase activity level.

## 2020-09-02 NOTE — Assessment & Plan Note (Signed)
Well controlled, no frequent use of albuterol inhaler.

## 2020-09-02 NOTE — Patient Instructions (Signed)
Stop by the lab prior to leaving today. I will notify you of your results once received.   Hold your morning dose of hydralazine 50 mg for blood pressure, just take your evening dose for now.  Start the new olmesartan blood pressure medication to replace losartan.  Continue metoprolol succinate 50 mg for blood pressure.  Please update me regarding your blood pressure readings in a few weeks.  Start exercising. You should be getting 150 minutes of moderate intensity exercise weekly.  It's important to improve your diet by reducing consumption of fast food, fried food, processed snack foods, sugary drinks. Increase consumption of fresh vegetables and fruits, whole grains, water.  Ensure you are drinking 64 ounces of water daily.  It was a pleasure to see you today!   Preventive Care 70 Years and Older, Female Preventive care refers to lifestyle choices and visits with your health care provider that can promote health and wellness. This includes:  A yearly physical exam. This is also called an annual wellness visit.  Regular dental and eye exams.  Immunizations.  Screening for certain conditions.  Healthy lifestyle choices, such as: ? Eating a healthy diet. ? Getting regular exercise. ? Not using drugs or products that contain nicotine and tobacco. ? Limiting alcohol use. What can I expect for my preventive care visit? Physical exam Your health care provider will check your:  Height and weight. These may be used to calculate your BMI (body mass index). BMI is a measurement that tells if you are at a healthy weight.  Heart rate and blood pressure.  Body temperature.  Skin for abnormal spots. Counseling Your health care provider may ask you questions about your:  Past medical problems.  Family's medical history.  Alcohol, tobacco, and drug use.  Emotional well-being.  Home life and relationship well-being.  Sexual activity.  Diet, exercise, and sleep  habits.  History of falls.  Memory and ability to understand (cognition).  Work and work Statistician.  Pregnancy and menstrual history.  Access to firearms. What immunizations do I need? Vaccines are usually given at various ages, according to a schedule. Your health care provider will recommend vaccines for you based on your age, medical history, and lifestyle or other factors, such as travel or where you work.   What tests do I need? Blood tests  Lipid and cholesterol levels. These may be checked every 5 years, or more often depending on your overall health.  Hepatitis C test.  Hepatitis B test. Screening  Lung cancer screening. You may have this screening every year starting at age 70 if you have a 30-pack-year history of smoking and currently smoke or have quit within the past 15 years.  Colorectal cancer screening. ? All adults should have this screening starting at age 40 and continuing until age 78. ? Your health care provider may recommend screening at age 44 if you are at increased risk. ? You will have tests every 1-10 years, depending on your results and the type of screening test.  Diabetes screening. ? This is done by checking your blood sugar (glucose) after you have not eaten for a while (fasting). ? You may have this done every 1-3 years.  Mammogram. ? This may be done every 1-2 years. ? Talk with your health care provider about how often you should have regular mammograms.  Abdominal aortic aneurysm (AAA) screening. You may need this if you are a current or former smoker.  BRCA-related cancer screening. This may be done  if you have a family history of breast, ovarian, tubal, or peritoneal cancers. Other tests  STD (sexually transmitted disease) testing, if you are at risk.  Bone density scan. This is done to screen for osteoporosis. You may have this done starting at age 70. Talk with your health care provider about your test results, treatment options,  and if necessary, the need for more tests. Follow these instructions at home: Eating and drinking  Eat a diet that includes fresh fruits and vegetables, whole grains, lean protein, and low-fat dairy products. Limit your intake of foods with high amounts of sugar, saturated fats, and salt.  Take vitamin and mineral supplements as recommended by your health care provider.  Do not drink alcohol if your health care provider tells you not to drink.  If you drink alcohol: ? Limit how much you have to 0-1 drink a day. ? Be aware of how much alcohol is in your drink. In the [place] one drink equals one 12 oz bottle of beer (355 mL), one 5 oz glass of wine (148 mL), or one 1 oz glass of hard liquor (44 mL).   Lifestyle  Take daily care of your teeth and gums. Brush your teeth every morning and night with fluoride toothpaste. Floss one time each day.  Stay active. Exercise for at least 30 minutes 5 or more days each week.  Do not use any products that contain nicotine or tobacco, such as cigarettes, e-cigarettes, and chewing tobacco. If you need help quitting, ask your health care provider.  Do not use drugs.  If you are sexually active, practice safe sex. Use a condom or other form of protection in order to prevent STIs (sexually transmitted infections).  Talk with your health care provider about taking a low-dose aspirin or statin.  Find healthy ways to cope with stress, such as: ? Meditation, yoga, or listening to music. ? Journaling. ? Talking to a trusted person. ? Spending time with friends and family. Safety  Always wear your seat belt while driving or riding in a vehicle.  Do not drive: ? If you have been drinking alcohol. Do not ride with someone who has been drinking. ? When you are tired or distracted. ? While texting.  Wear a helmet and other protective equipment during sports activities.  If you have firearms in your house, make sure you follow all gun safety  procedures. What's next?  Visit your health care provider once a year for an annual wellness visit.  Ask your health care provider how often you should have your eyes and teeth checked.  Stay up to date on all vaccines. This information is not intended to replace advice given to you by your health care provider. Make sure you discuss any questions you have with your health care provider. Document Revised: 06/15/2020 Document Reviewed: 06/19/2018 Elsevier Patient Education  2021 [name].

## 2020-09-02 NOTE — Assessment & Plan Note (Addendum)
Well controlled on hydralazine 50 mg BID but she does experience drowsiness. She is now on olmesartan 20 mg as losartan is on backorder. She is compliant to metoprolol succinate 50 mg.   She would like to discontinue hydralazine due to drowsiness, but I am reticent given her excellent blood pressure control.  We agreed for her to discontinue her morning dose of hydralazine, continue hydralazine 50 mg at night.  She will start olmesartan 20 mg, continue metoprolol succinate 50 mg.  She will call and update in a few weeks regarding blood pressure readings on this regimen.  CMP pending.

## 2020-09-02 NOTE — Progress Notes (Signed)
Subjective:    Patient ID: Jennifer Cannon, female    DOB: [birth year], 70 y.o.   MRN: [id number]  HPI  This visit occurred during the SARS-CoV-2 public health emergency.  Safety protocols were in place, including screening questions prior to the visit, additional usage of staff PPE, and extensive cleaning of exam room while observing appropriate contact time as indicated for disinfecting solutions.   Jennifer Cannon is a 70 year old female who presents today for complete physical.  Immunizations: -Influenza: Declines  -Shingles: Declines  -Pneumonia: Declines  -Covid-19: Completed 2 vaccines  Diet: She endorses a fair diet. Exercise: No regular exercise  Eye exam: No recent exam, she will schedule Dental exam: No recent exam   Mammogram: Declines  Dexa: Declines  Colonoscopy: Declines  Hep C Screen: Negative   BP Readings from Last 3 Encounters:  09/02/20 128/78  03/03/20 140/84  09/02/19 (!) 144/90   Wt Readings from Last 3 Encounters:  09/02/20 209 lb 4 oz (94.9 kg)  03/03/20 209 lb 4 oz (94.9 kg)  09/02/19 219 lb (99.3 kg)     Review of Systems  Constitutional: Negative for unexpected weight change.  HENT: Negative for rhinorrhea.   Eyes: Negative for visual disturbance.  Respiratory: Negative for cough.   Cardiovascular: Negative for chest pain.  Gastrointestinal: Negative for constipation and diarrhea.  Genitourinary: Negative for difficulty urinating.  Musculoskeletal: Negative for arthralgias and myalgias.  Skin: Negative for rash.  Allergic/Immunologic: Negative for environmental allergies.  Neurological: Negative for dizziness, numbness and headaches.  Psychiatric/Behavioral: The patient is not nervous/anxious.        Past Medical History:  Diagnosis Date  . Asthma   . Essential hypertension   . GAD (generalized anxiety disorder)   . Impacted cerumen of right ear 03/13/2018     Social History   Socioeconomic History  . Marital status: Widowed     Spouse name: Not on file  . Number of children: Not on file  . Years of education: Not on file  . Highest education level: Not on file  Occupational History  . Not on file  Tobacco Use  . Smoking status: Former Smoker    Packs/day: 0.50    Types: Cigarettes  . Smokeless tobacco: Never Used  Substance and Sexual Activity  . Alcohol use: No  . Drug use: No  . Sexual activity: Not Currently  Other Topics Concern  . Not on file  Social History Narrative   Widowed.   Retired, once worked in Physiological scientist.   Enjoys crocheting, making cards.     Social Determinants of Health   Financial Resource Strain: Low Risk   . Difficulty of Paying Living Expenses: Not hard at all  Food Insecurity: No Food Insecurity  . Worried About Programme researcher, broadcasting/film/video in the Last Year: Never true  . Ran Out of Food in the Last Year: Never true  Transportation Needs: No Transportation Needs  . Lack of Transportation (Medical): No  . Lack of Transportation (Non-Medical): No  Physical Activity: Inactive  . Days of Exercise per Week: 0 days  . Minutes of Exercise per Session: 0 min  Stress: No Stress Concern Present  . Feeling of Stress : Not at all  Social Connections: Not on file  Intimate Partner Violence: Not At Risk  . Fear of Current or Ex-Partner: No  . Emotionally Abused: No  . Physically Abused: No  . Sexually Abused: No    Past Surgical History:  Procedure  Laterality Date  . CHOLECYSTECTOMY  2000  . TONSILLECTOMY      Family History  Problem Relation Age of Onset  . Hypertension Mother   . Diabetes Mother   . Hypertension Father   . Stroke Father   . COPD Sister   . Cancer Sister   . Hypertension Sister   . Hypertension Sister     Allergies  Allergen Reactions  . Amlodipine     Ankle edema, shortness of breath, dizziness  . Hctz [Hydrochlorothiazide] Other (See Comments)    Dry mouth, fatigue  . Lisinopril     Current Outpatient Medications on File Prior to Visit   Medication Sig Dispense Refill  . albuterol (VENTOLIN HFA) 108 (90 Base) MCG/ACT inhaler INHALE 1 TO 2 PUFFS INTO THE LUNGS EVERY 6 HOURS AS NEEDED FOR WHEEZE 8.5 each 0  . Cholecalciferol (VITAMIN D3) 50 MCG (2000 UT) TABS Take 1 tablet by mouth.    . magnesium gluconate (MAGONATE) 500 MG tablet Take 500 mg by mouth daily.    . metoprolol succinate (TOPROL-XL) 50 MG 24 hr tablet TAKE 1 TABLET BY MOUTH DAILY FOR BLOOD PRESSURE. TAKE WITH OR IMMEDIATELY FOLLOWING A MEAL 90 tablet 1  . olmesartan (BENICAR) 20 MG tablet Take 1 tablet (20 mg total) by mouth daily. For blood pressure. 90 tablet 0   No current facility-administered medications on file prior to visit.    BP 128/78   Pulse 83   Temp (!) 97.2 F (36.2 C) (Temporal)   Ht 5\' 8"  (1.727 m)   Wt 209 lb 4 oz (94.9 kg)   SpO2 97%   BMI 31.82 kg/m    Objective:   Physical Exam Constitutional:      Appearance: She is well-nourished.  HENT:     Right Ear: Tympanic membrane and ear canal normal.     Left Ear: Tympanic membrane and ear canal normal.     Mouth/Throat:     Mouth: Oropharynx is clear and moist.  Eyes:     Extraocular Movements: EOM normal.     Pupils: Pupils are equal, round, and reactive to light.  Cardiovascular:     Rate and Rhythm: Normal rate and regular rhythm.  Pulmonary:     Effort: Pulmonary effort is normal.     Breath sounds: Normal breath sounds.  Abdominal:     General: Bowel sounds are normal.     Palpations: Abdomen is soft.     Tenderness: There is no abdominal tenderness.  Musculoskeletal:        General: Normal range of motion.     Cervical back: Neck supple.  Skin:    General: Skin is warm and dry.  Neurological:     Mental Status: She is alert and oriented to person, place, and time.     Cranial Nerves: No cranial nerve deficit.     Deep Tendon Reflexes:     Reflex Scores:      Patellar reflexes are 2+ on the right side and 2+ on the left side. Psychiatric:        Mood and Affect:  Mood and affect and mood normal.            Assessment & Plan:

## 2020-09-02 NOTE — Assessment & Plan Note (Signed)
Declines all immunizations despite recommendations. Declines mammogram despite recommendations.  Declines colon cancer screening despite recommendations.  Discussed the importance of a healthy diet and regular exercise in order for weight loss, and to reduce the risk of any potential medical problems.  Exam today stable. Labs pending.

## 2020-09-02 NOTE — Assessment & Plan Note (Signed)
Discussed the importance of a healthy diet and regular exercise in order for weight loss, and to reduce the risk of any potential medical problems.  Repeat A1C pending. 

## 2020-09-02 NOTE — Assessment & Plan Note (Signed)
Chronic, intermittent, overall doing well. She will update if symptoms increase.

## 2020-09-07 ENCOUNTER — Other Ambulatory Visit: Payer: Self-pay | Admitting: Primary Care

## 2020-09-07 DIAGNOSIS — J4520 Mild intermittent asthma, uncomplicated: Secondary | ICD-10-CM

## 2020-09-15 DIAGNOSIS — I10 Essential (primary) hypertension: Secondary | ICD-10-CM

## 2020-09-15 MED ORDER — HYDRALAZINE HCL 50 MG PO TABS: 50.0000 mg | ORAL_TABLET | Freq: Every evening | ORAL | 3 refills | Status: DC

## 2020-09-15 NOTE — Telephone Encounter (Signed)
Ok to send in new script?

## 2020-10-01 ENCOUNTER — Other Ambulatory Visit: Payer: Self-pay | Admitting: Primary Care

## 2020-10-01 DIAGNOSIS — J4520 Mild intermittent asthma, uncomplicated: Secondary | ICD-10-CM

## 2020-10-03 ENCOUNTER — Other Ambulatory Visit: Payer: Self-pay | Admitting: Primary Care

## 2020-10-03 DIAGNOSIS — I10 Essential (primary) hypertension: Secondary | ICD-10-CM

## 2020-10-04 ENCOUNTER — Other Ambulatory Visit: Payer: Self-pay | Admitting: Primary Care

## 2020-10-04 DIAGNOSIS — I10 Essential (primary) hypertension: Secondary | ICD-10-CM

## 2020-10-04 MED ORDER — LOSARTAN POTASSIUM 100 MG PO TABS: 100.0000 mg | ORAL_TABLET | Freq: Every day | ORAL | 3 refills | Status: DC

## 2020-10-04 NOTE — Telephone Encounter (Signed)
Pt called in and stated that she doesn't like the olmesartan due to it makes her cough, and have shortness of breath.

## 2020-10-05 ENCOUNTER — Other Ambulatory Visit: Payer: Self-pay | Admitting: Primary Care

## 2020-10-05 DIAGNOSIS — J4520 Mild intermittent asthma, uncomplicated: Secondary | ICD-10-CM

## 2020-10-06 ENCOUNTER — Other Ambulatory Visit: Payer: Self-pay | Admitting: Primary Care

## 2020-10-06 DIAGNOSIS — J4520 Mild intermittent asthma, uncomplicated: Secondary | ICD-10-CM

## 2020-11-06 ENCOUNTER — Other Ambulatory Visit: Payer: Self-pay | Admitting: Primary Care

## 2020-11-06 DIAGNOSIS — J4520 Mild intermittent asthma, uncomplicated: Secondary | ICD-10-CM

## 2020-12-04 ENCOUNTER — Other Ambulatory Visit: Payer: Self-pay | Admitting: Family Medicine

## 2020-12-04 DIAGNOSIS — J4520 Mild intermittent asthma, uncomplicated: Secondary | ICD-10-CM

## 2020-12-09 DIAGNOSIS — J4520 Mild intermittent asthma, uncomplicated: Secondary | ICD-10-CM

## 2020-12-09 MED ORDER — ALBUTEROL SULFATE HFA 108 (90 BASE) MCG/ACT IN AERS: 1.0000 | INHALATION_SPRAY | Freq: Four times a day (QID) | RESPIRATORY_TRACT | 0 refills | Status: DC | PRN

## 2020-12-16 ENCOUNTER — Ambulatory Visit (INDEPENDENT_AMBULATORY_CARE_PROVIDER_SITE_OTHER): Payer: PPO | Admitting: Primary Care

## 2020-12-16 ENCOUNTER — Ambulatory Visit (INDEPENDENT_AMBULATORY_CARE_PROVIDER_SITE_OTHER)
Admission: RE | Admit: 2020-12-16 | Discharge: 2020-12-16 | Disposition: A | Discharge status: Home / Self Care | Payer: PPO | Source: Ambulatory Visit | Attending: Primary Care | Admitting: Primary Care

## 2020-12-16 ENCOUNTER — Other Ambulatory Visit: Payer: Self-pay

## 2020-12-16 VITALS — BP 142/94 | HR 72 | Temp 97.6°F | Ht 68.0 in | Wt 208.5 lb

## 2020-12-16 DIAGNOSIS — R5383 Other fatigue: Secondary | ICD-10-CM | POA: Diagnosis not present

## 2020-12-16 DIAGNOSIS — J45909 Unspecified asthma, uncomplicated: Secondary | ICD-10-CM

## 2020-12-16 DIAGNOSIS — I10 Essential (primary) hypertension: Secondary | ICD-10-CM | POA: Diagnosis not present

## 2020-12-16 DIAGNOSIS — R0600 Dyspnea, unspecified: Secondary | ICD-10-CM

## 2020-12-16 DIAGNOSIS — J4520 Mild intermittent asthma, uncomplicated: Secondary | ICD-10-CM

## 2020-12-16 DIAGNOSIS — R0609 Other forms of dyspnea: Secondary | ICD-10-CM

## 2020-12-16 MED ORDER — BUDESONIDE-FORMOTEROL FUMARATE 160-4.5 MCG/ACT IN AERO: 2.0000 | INHALATION_SPRAY | Freq: Two times a day (BID) | RESPIRATORY_TRACT | 0 refills | Status: DC

## 2020-12-16 NOTE — Assessment & Plan Note (Signed)
Either asthma or underlying COPD is uncontrolled.  Recurrent use of albuterol inhaler which is inappropriate.   Checking chest xray. Rx for Symbicort sent to pharmacy to use BID.  We will see her back In a few weeks for follow up.

## 2020-12-16 NOTE — Progress Notes (Signed)
Subjective:    Patient ID: Jennifer Cannon, female    DOB: [birth year], 70 y.o.   MRN: [id number]  HPI  Jennifer Cannon is a very pleasant 70 y.o. female with a history of hypertension, asthma, GERD, prediabetes, fatigue who presents today to discuss her medication regimen.  1) Essential Hypertension: Currently managed on losartan 100 mg, metoprolol succinate 50 mg, hydralazine 50 mg daily. She cannot tolerate amlodipine, HCTZ, or lisinopril.   She doesn't feel well on "this medication I'm taking" as she feels tired and "gets out of breath". Increased shortness of breath with walking, had a tough time when walking [place], also has a tough time at the grocery store, household chores. Intermittent dizziness with positional changes. Denies symptoms with rest, chest pain, nausea, headaches, lower extremity edema.    She is monitoring her BP at home which is running 130's-140's/70's-80's.  BP Readings from Last 3 Encounters:  12/16/20 (!) 142/94  09/02/20 128/78  03/03/20 140/84   2) Asthma: Chronic, increased dyspnea with exertion over the last several months. She will use her albuterol inhaler 2-3 times daily with improvement and has been doing so for several weeks. She has noticed cough and congestion with wheezing, intermittent.   History of tobacco abuse, smoked 30+ years 1/2 PPD on average.   Review of Systems  Constitutional:  Positive for fatigue. Negative for fever.  HENT:  Positive for congestion.   Eyes:  Negative for visual disturbance.  Respiratory:  Positive for cough, shortness of breath and wheezing.   Cardiovascular:  Negative for chest pain and leg swelling.  Neurological:  Negative for headaches.        Past Medical History:  Diagnosis Date   Asthma    Essential hypertension    GAD (generalized anxiety disorder)    Impacted cerumen of right ear 03/13/2018    Social History   Socioeconomic History   Marital status: Widowed    Spouse  name: Not on file   Number of children: Not on file   Years of education: Not on file   Highest education level: Not on file  Occupational History   Not on file  Tobacco Use   Smoking status: Former    Packs/day: 0.50    Pack years: 0.00    Types: Cigarettes   Smokeless tobacco: Never  Substance and Sexual Activity   Alcohol use: No   Drug use: No   Sexual activity: Not Currently  Other Topics Concern   Not on file  Social History Narrative   Widowed.   Retired, once worked in Physiological scientist.   Enjoys crocheting, making cards.     Social Determinants of Health   Financial Resource Strain: Low Risk    Difficulty of Paying Living Expenses: Not hard at all  Food Insecurity: No Food Insecurity   Worried About Programme researcher, broadcasting/film/video in the Last Year: Never true   Ran Out of Food in the Last Year: Never true  Transportation Needs: No Transportation Needs   Lack of Transportation (Medical): No   Lack of Transportation (Non-Medical): No  Physical Activity: Inactive   Days of Exercise per Week: 0 days   Minutes of Exercise per Session: 0 min  Stress: No Stress Concern Present   Feeling of Stress : Not at all  Social Connections: Not on file  Intimate Partner Violence: Not At Risk   Fear of Current or Ex-Partner: No   Emotionally Abused: No   Physically  Abused: No   Sexually Abused: No    Past Surgical History:  Procedure Laterality Date   CHOLECYSTECTOMY  2000   TONSILLECTOMY      Family History  Problem Relation Age of Onset   Hypertension Mother    Diabetes Mother    Hypertension Father    Stroke Father    COPD Sister    Cancer Sister    Hypertension Sister    Hypertension Sister     Allergies  Allergen Reactions   Amlodipine     Ankle edema, shortness of breath, dizziness   Hctz [Hydrochlorothiazide] Other (See Comments)    Dry mouth, fatigue   Lisinopril     Current Outpatient Medications on File Prior to Visit  Medication Sig Dispense Refill    albuterol (VENTOLIN HFA) 108 (90 Base) MCG/ACT inhaler Inhale 1-2 puffs into the lungs every 6 (six) hours as needed for wheezing or shortness of breath. 8.5 each 0   Cholecalciferol (VITAMIN D3) 50 MCG (2000 UT) TABS Take 1 tablet by mouth.     hydrALAZINE (APRESOLINE) 50 MG tablet Take 1 tablet (50 mg total) by mouth every evening. For blood pressure 90 tablet 3   losartan (COZAAR) 100 MG tablet Take 1 tablet (100 mg total) by mouth daily. For blood pressure. 90 tablet 3   magnesium gluconate (MAGONATE) 500 MG tablet Take 500 mg by mouth daily.     metoprolol succinate (TOPROL-XL) 50 MG 24 hr tablet Take 1 tablet (50 mg total) by mouth daily. For blood pressure. 90 tablet 3   No current facility-administered medications on file prior to visit.    BP (!) 142/94   Pulse 72   Temp 97.6 F (36.4 C) (Temporal)   Ht 5\' 8"  (1.727 m)   Wt 208 lb 8 oz (94.6 kg)   SpO2 96%   BMI 31.70 kg/m  Objective:   Physical Exam Cardiovascular:     Rate and Rhythm: Normal rate and regular rhythm.     Comments: No lower extremity edema noted Pulmonary:     Effort: Pulmonary effort is normal.     Breath sounds: Examination of the right-upper field reveals rhonchi. Examination of the left-upper field reveals rhonchi. Examination of the right-lower field reveals rhonchi. Examination of the left-lower field reveals rhonchi. Rhonchi present. No decreased breath sounds or wheezing.  Musculoskeletal:     Cervical back: Neck supple.  Skin:    General: Skin is warm and dry.          Assessment & Plan:      This visit occurred during the SARS-CoV-2 public health emergency.  Safety protocols were in place, including screening questions prior to the visit, additional usage of staff PPE, and extensive cleaning of exam room while observing appropriate contact time as indicated for disinfecting solutions.

## 2020-12-16 NOTE — Assessment & Plan Note (Signed)
Uncontrolled today, home readings are better.  Cannot tolerate: Lisinopril, olmesartan, HCTZ, amlodipine.  Will also discontinue metoprolol as it may be causing some fatigue. Reduce to 25 mg daily for 1 week then stop.   Labs pending.

## 2020-12-16 NOTE — Assessment & Plan Note (Signed)
Continued, seems to be progressing.  No evidence of acute CHF on exam. Checking BNP and BMP.  Differentials include CAD, COPD, Asthma, deconditioning.   Checking chest xray today. Rx for Symbicort sent to pharmacy based off of lung sounds, improvement with albuterol, inappropriate use of albuterol.   ECG today with NSR with rate of 71. No PAC/PVC, acute ST changes. Overall appears similar to ECG from 2020.

## 2020-12-16 NOTE — Assessment & Plan Note (Signed)
Will wean off metoprolol succinate. Will also treat COPD/asthma.  Labs pending.

## 2020-12-16 NOTE — Patient Instructions (Addendum)
Stop by the lab and xray prior to leaving today. I will notify you of your results once received.   Wean off of metoprolol succinate by taking 1/2 tablet daily for about one week then stop.   Start budesonide-formoterol (Symbicort) inhaler for breathing. Inhale 2 puffs into the lungs twice daily, everyday.  Use the albuterol inhaler only if needed.  Please schedule a follow up visit to meet back with me in 2-3 weeks for blood pressure check and breathing check.   It was a pleasure to see you today!

## 2020-12-17 LAB — BASIC METABOLIC PANEL
BUN/Creatinine Ratio: 9 (calc) (ref 6–22)
BUN: 11 mg/dL (ref 7–25)
CO2: 27 mmol/L (ref 20–32)
Calcium: 9.9 mg/dL (ref 8.6–10.4)
Chloride: 96 mmol/L — ABNORMAL LOW (ref 98–110)
Creat: 1.2 mg/dL — ABNORMAL HIGH (ref 0.50–0.99)
Glucose, Bld: 80 mg/dL (ref 65–99)
Potassium: 5.2 mmol/L (ref 3.5–5.3)
Sodium: 133 mmol/L — ABNORMAL LOW (ref 135–146)

## 2020-12-17 LAB — BRAIN NATRIURETIC PEPTIDE: Brain Natriuretic Peptide: 97 pg/mL (ref <100)

## 2020-12-17 LAB — TSH: TSH: 0.76 mIU/L (ref 0.40–4.50)

## 2021-01-01 ENCOUNTER — Other Ambulatory Visit: Payer: Self-pay | Admitting: Primary Care

## 2021-01-01 DIAGNOSIS — J4520 Mild intermittent asthma, uncomplicated: Secondary | ICD-10-CM

## 2021-01-06 ENCOUNTER — Ambulatory Visit (INDEPENDENT_AMBULATORY_CARE_PROVIDER_SITE_OTHER): Payer: PPO | Admitting: Primary Care

## 2021-01-06 ENCOUNTER — Other Ambulatory Visit: Payer: Self-pay

## 2021-01-06 VITALS — BP 140/80 | HR 100 | Temp 97.8°F | Resp 16 | Ht 68.0 in | Wt 206.0 lb

## 2021-01-06 DIAGNOSIS — I10 Essential (primary) hypertension: Secondary | ICD-10-CM | POA: Diagnosis not present

## 2021-01-06 DIAGNOSIS — R5383 Other fatigue: Secondary | ICD-10-CM

## 2021-01-06 DIAGNOSIS — J449 Chronic obstructive pulmonary disease, unspecified: Secondary | ICD-10-CM | POA: Insufficient documentation

## 2021-01-06 DIAGNOSIS — R0600 Dyspnea, unspecified: Secondary | ICD-10-CM | POA: Diagnosis not present

## 2021-01-06 DIAGNOSIS — R0609 Other forms of dyspnea: Secondary | ICD-10-CM

## 2021-01-06 DIAGNOSIS — F411 Generalized anxiety disorder: Secondary | ICD-10-CM

## 2021-01-06 DIAGNOSIS — J439 Emphysema, unspecified: Secondary | ICD-10-CM | POA: Diagnosis not present

## 2021-01-06 DIAGNOSIS — E871 Hypo-osmolality and hyponatremia: Secondary | ICD-10-CM | POA: Diagnosis not present

## 2021-01-06 MED ORDER — HYDROXYZINE HCL 10 MG PO TABS: ORAL_TABLET | ORAL | 0 refills | Status: DC

## 2021-01-06 NOTE — Patient Instructions (Signed)
It was a pleasure to see you today!   

## 2021-01-06 NOTE — Assessment & Plan Note (Signed)
Improved since initiation of Symbicort BID. Continue same.

## 2021-01-06 NOTE — Assessment & Plan Note (Signed)
Stable today, home readings are better. Continue hydralazine 50 mg daily and losartan 100 mg daily.

## 2021-01-06 NOTE — Progress Notes (Signed)
Subjective:    Patient ID: Jennifer Cannon, female    DOB: 07/17/1950, 70 y.o.   MRN: [id number]  HPI  Jennifer Cannon is a very pleasant 70 y.o. female who presents today for follow up of hypertension.  She was last evaluated on 12/16/20, requesting to come off of some medications as she suspected them to be causing symptoms of exertional dyspnea and fatigue. She endorsed increased dyspnea on exertion over the prior months, using albuterol inhaler 2-3 times daily with improvement. History of tobacco abuse.   During her last visit we decided to discontinue metoprolol given potential effects of fatigue. We did add Symbicort inhaler and discouraged recurrent SABA use. Chest xray was negative for acute findings. ECG was stable compared to 2020. Labs showed chronic hyponatremia.   Since her last visit she's compliant to her Symbicort BID, no use of albuterol. She weaned off metoprolol. She has more energy, less exertional shortness of breath. She's feeling a lot better. She is checking her BP at home which is mostly running in the 130's/80's. Pulse is running below 90.   She has never been notified about her lower potassium, admits that she eats little to no salty foods. Doesn't add salt to meals.   She would like to discuss her chronic situational anxiety. Denies daily anxiety but does find herself anxious in certain situations. She would like to have a medication on hand to take as needed for anxiety, doesn't want to take anything everyday.   BP Readings from Last 3 Encounters:  01/06/21 140/80  12/16/20 (!) 142/94  09/02/20 128/78        Review of Systems  Constitutional:        Improved fatigue  Respiratory:         Improved exertional SOB  Cardiovascular:  Negative for chest pain.  Psychiatric/Behavioral:  The patient is nervous/anxious.         Past Medical History:  Diagnosis Date   Asthma    Essential hypertension    GAD (generalized anxiety disorder)    Impacted  cerumen of right ear 03/13/2018    Social History   Socioeconomic History   Marital status: Widowed    Spouse name: Not on file   Number of children: Not on file   Years of education: Not on file   Highest education level: Not on file  Occupational History   Not on file  Tobacco Use   Smoking status: Former    Packs/day: 0.50    Pack years: 0.00    Types: Cigarettes   Smokeless tobacco: Never  Substance and Sexual Activity   Alcohol use: No   Drug use: No   Sexual activity: Not Currently  Other Topics Concern   Not on file  Social History Narrative   Widowed.   Retired, once worked in Physiological scientist.   Enjoys crocheting, making cards.     Social Determinants of Health   Financial Resource Strain: Low Risk    Difficulty of Paying Living Expenses: Not hard at all  Food Insecurity: No Food Insecurity   Worried About Programme researcher, broadcasting/film/video in the Last Year: Never true   Ran Out of Food in the Last Year: Never true  Transportation Needs: No Transportation Needs   Lack of Transportation (Medical): No   Lack of Transportation (Non-Medical): No  Physical Activity: Inactive   Days of Exercise per Week: 0 days   Minutes of Exercise per Session: 0 min  Stress: No Stress  Concern Present   Feeling of Stress : Not at all  Social Connections: Not on file  Intimate Partner Violence: Not At Risk   Fear of Current or Ex-Partner: No   Emotionally Abused: No   Physically Abused: No   Sexually Abused: No    Past Surgical History:  Procedure Laterality Date   CHOLECYSTECTOMY  2000   TONSILLECTOMY      Family History  Problem Relation Age of Onset   Hypertension Mother    Diabetes Mother    Hypertension Father    Stroke Father    COPD Sister    Cancer Sister    Hypertension Sister    Hypertension Sister     Allergies  Allergen Reactions   Amlodipine     Ankle edema, shortness of breath, dizziness   Hctz [Hydrochlorothiazide] Other (See Comments)    Dry mouth,  fatigue   Lisinopril     Current Outpatient Medications on File Prior to Visit  Medication Sig Dispense Refill   albuterol (VENTOLIN HFA) 108 (90 Base) MCG/ACT inhaler Inhale 1-2 puffs into the lungs every 6 (six) hours as needed for wheezing or shortness of breath. 8.5 each 0   budesonide-formoterol (SYMBICORT) 160-4.5 MCG/ACT inhaler Inhale 2 puffs into the lungs 2 (two) times daily. 1 each 0   Cholecalciferol (VITAMIN D3) 50 MCG (2000 UT) TABS Take 1 tablet by mouth.     hydrALAZINE (APRESOLINE) 50 MG tablet Take 1 tablet (50 mg total) by mouth every evening. For blood pressure 90 tablet 3   losartan (COZAAR) 100 MG tablet Take 1 tablet (100 mg total) by mouth daily. For blood pressure. 90 tablet 3   magnesium gluconate (MAGONATE) 500 MG tablet Take 500 mg by mouth daily.     metoprolol succinate (TOPROL-XL) 50 MG 24 hr tablet Take 1 tablet (50 mg total) by mouth daily. For blood pressure. (Patient not taking: Reported on 01/06/2021) 90 tablet 3   No current facility-administered medications on file prior to visit.    BP 140/80   Pulse 100   Temp 97.8 F (36.6 C)   Resp 16   Ht 5\' 8"  (1.727 m)   Wt 206 lb (93.4 kg)   SpO2 98%   BMI 31.32 kg/m  Objective:   Physical Exam Cardiovascular:     Rate and Rhythm: Normal rate and regular rhythm.  Pulmonary:     Effort: Pulmonary effort is normal.     Breath sounds: Normal breath sounds.  Musculoskeletal:     Cervical back: Neck supple.  Skin:    General: Skin is warm and dry.          Assessment & Plan:      This visit occurred during the SARS-CoV-2 public health emergency.  Safety protocols were in place, including screening questions prior to the visit, additional usage of staff PPE, and extensive cleaning of exam room while observing appropriate contact time as indicated for disinfecting solutions.

## 2021-01-06 NOTE — Assessment & Plan Note (Signed)
Very slightly with sodium level of 133. No alarm signs with HPI. Not on diuretic.  Offered to work this up more, she declines.  Continue to monitor.

## 2021-01-06 NOTE — Assessment & Plan Note (Signed)
Chronic, requesting PRN medication.  I believe that she would benefit from low dose SSRI daily, but she'd like to try PRN first.  Rx for hydroxyzine 10 mg sent to pharmacy. Drowsiness precautions provided. She will update.

## 2021-01-06 NOTE — Assessment & Plan Note (Signed)
Improving since introduction of Symbicort for presumed COPD. Also since removal of metoprolol.  Continue Symbicort, continue off metoprolol

## 2021-01-06 NOTE — Assessment & Plan Note (Signed)
Noted on chest xray from last visit. History of tobacco abuse for at least 30 years.  Improved exertional dyspnea with Symbicort inhaler. No use of albuterol!  Continue current regimen.

## 2021-01-11 DIAGNOSIS — R0609 Other forms of dyspnea: Secondary | ICD-10-CM

## 2021-01-11 DIAGNOSIS — J4520 Mild intermittent asthma, uncomplicated: Secondary | ICD-10-CM

## 2021-01-11 MED ORDER — BUDESONIDE-FORMOTEROL FUMARATE 160-4.5 MCG/ACT IN AERO: 2.0000 | INHALATION_SPRAY | Freq: Two times a day (BID) | RESPIRATORY_TRACT | 3 refills | Status: DC

## 2021-01-28 ENCOUNTER — Other Ambulatory Visit: Payer: Self-pay | Admitting: Primary Care

## 2021-01-28 DIAGNOSIS — F411 Generalized anxiety disorder: Secondary | ICD-10-CM

## 2021-01-28 NOTE — Telephone Encounter (Signed)
How is she doing on the hydroxyzine 10 mg that we sent in on 01/06/21 for intermittent anxiety? Received refill request, how often is she taking this? Does she really need a refill?

## 2021-02-02 NOTE — Telephone Encounter (Signed)
Per my chart message patient did not request refill has only had to take few times. Has called pharmacy and taken off auto refill.

## 2021-07-27 DIAGNOSIS — J4520 Mild intermittent asthma, uncomplicated: Secondary | ICD-10-CM

## 2021-07-27 DIAGNOSIS — J439 Emphysema, unspecified: Secondary | ICD-10-CM

## 2021-07-27 NOTE — Telephone Encounter (Signed)
Last seen in office for this 01/06/21. Last cpe 09/02/20 not follow up with you but has upcomming health coach visit.

## 2021-07-28 MED ORDER — FLUTICASONE FUROATE-VILANTEROL 100-25 MCG/ACT IN AEPB: 1.0000 | INHALATION_SPRAY | Freq: Every day | RESPIRATORY_TRACT | 0 refills | Status: DC

## 2021-08-23 ENCOUNTER — Ambulatory Visit (INDEPENDENT_AMBULATORY_CARE_PROVIDER_SITE_OTHER): Payer: PPO | Admitting: Primary Care

## 2021-08-23 ENCOUNTER — Encounter: Payer: Self-pay | Admitting: Primary Care

## 2021-08-23 ENCOUNTER — Other Ambulatory Visit: Payer: Self-pay

## 2021-08-23 VITALS — BP 164/90 | HR 106 | Temp 98.6°F | Ht 68.0 in | Wt 219.0 lb

## 2021-08-23 DIAGNOSIS — Z1159 Encounter for screening for other viral diseases: Secondary | ICD-10-CM | POA: Diagnosis not present

## 2021-08-23 DIAGNOSIS — R7303 Prediabetes: Secondary | ICD-10-CM | POA: Diagnosis not present

## 2021-08-23 DIAGNOSIS — F411 Generalized anxiety disorder: Secondary | ICD-10-CM | POA: Diagnosis not present

## 2021-08-23 DIAGNOSIS — J439 Emphysema, unspecified: Secondary | ICD-10-CM

## 2021-08-23 DIAGNOSIS — Z0000 Encounter for general adult medical examination without abnormal findings: Secondary | ICD-10-CM | POA: Diagnosis not present

## 2021-08-23 DIAGNOSIS — I10 Essential (primary) hypertension: Secondary | ICD-10-CM

## 2021-08-23 DIAGNOSIS — Z87891 Personal history of nicotine dependence: Secondary | ICD-10-CM | POA: Insufficient documentation

## 2021-08-23 DIAGNOSIS — J4520 Mild intermittent asthma, uncomplicated: Secondary | ICD-10-CM

## 2021-08-23 LAB — COMPREHENSIVE METABOLIC PANEL
ALT: 15 U/L (ref 0–35)
AST: 16 U/L (ref 0–37)
Albumin: 4.4 g/dL (ref 3.5–5.2)
Alkaline Phosphatase: 77 U/L (ref 39–117)
BUN: 8 mg/dL (ref 6–23)
CO2: 26 mEq/L (ref 19–32)
Calcium: 9.8 mg/dL (ref 8.4–10.5)
Chloride: 98 mEq/L (ref 96–112)
Creatinine, Ser: 1.07 mg/dL (ref 0.40–1.20)
GFR: 52.68 mL/min — ABNORMAL LOW (ref >60.00)
Glucose, Bld: 107 mg/dL — ABNORMAL HIGH (ref 70–99)
Potassium: 4.9 mEq/L (ref 3.5–5.1)
Sodium: 132 mEq/L — ABNORMAL LOW (ref 135–145)
Total Bilirubin: 0.9 mg/dL (ref 0.2–1.2)
Total Protein: 8.4 g/dL — ABNORMAL HIGH (ref 6.0–8.3)

## 2021-08-23 LAB — LIPID PANEL
Cholesterol: 157 mg/dL (ref 0–200)
HDL: 48.6 mg/dL (ref >39.00)
LDL Cholesterol: 96 mg/dL (ref 0–99)
NonHDL: 108.27
Total CHOL/HDL Ratio: 3
Triglycerides: 61 mg/dL (ref 0.0–149.0)
VLDL: 12.2 mg/dL (ref 0.0–40.0)

## 2021-08-23 LAB — HEMOGLOBIN A1C: Hgb A1c MFr Bld: 5.8 % (ref 4.6–6.5)

## 2021-08-23 MED ORDER — ALBUTEROL SULFATE HFA 108 (90 BASE) MCG/ACT IN AERS: 1.0000 | INHALATION_SPRAY | Freq: Four times a day (QID) | RESPIRATORY_TRACT | 0 refills | Status: DC | PRN

## 2021-08-23 MED ORDER — LOSARTAN POTASSIUM 100 MG PO TABS: 100.0000 mg | ORAL_TABLET | Freq: Every day | ORAL | 3 refills | Status: DC

## 2021-08-23 NOTE — Assessment & Plan Note (Signed)
Quit 4 years ago, has 30 pack year history  Recommended lung cancer screening given her personal history of tobacco abuse and family history of lung cancer in her sister. She declines despite strong recommendations.

## 2021-08-23 NOTE — Progress Notes (Signed)
Subjective:    Patient ID: Jennifer Cannon, female    DOB: Sep 09, 1950, 71 y.o.   MRN: [id number]  HPI  Jennifer Cannon is a very pleasant 71 y.o. female with a history of hypertension, prediabetes, asthma, COPD, fatigue, chronic hyponatremia who presents today for complete physical and follow up of chronic conditions.   Immunizations: -Influenza: Declines  -Covid-19: Has completed 2 vaccines  -Shingles: Never completed, declines  -Pneumonia: Never completed, declines   Diet: Poor diet.  Exercise: No regular exercise.  Eye exam: Completes annually  Dental exam: Completes semi-annually   Mammogram: Completed years ago. Declines  Dexa: Never completed, declines  Colonoscopy: Completed > 10 years ago, declines   Lung Cancer Screening: Never completed, declines today. Family history of lung cancer in her sister.    Currently managed on losartan 100 mg daily, hydralazine 50 mg daily.   Prior intolerance to HCTZ, lisinopril, and amlodipine. She does not her BP at home. She denies headaches, dizziness, chest pain. She has been compliant to   When checking her BP at home it was running 130's-140's/80's.  BP Readings from Last 3 Encounters:  08/23/21 (!) 164/90  01/06/21 140/80  12/16/20 (!) 142/94     Review of Systems  Constitutional:  Negative for unexpected weight change.  HENT:  Negative for rhinorrhea.   Eyes:  Negative for visual disturbance.  Respiratory:  Negative for cough and shortness of breath.   Cardiovascular:  Negative for chest pain.  Gastrointestinal:  Negative for constipation and diarrhea.  Genitourinary:  Negative for difficulty urinating.  Musculoskeletal:  Negative for arthralgias and myalgias.  Skin:  Negative for rash.  Allergic/Immunologic: Negative for environmental allergies.  Neurological:  Negative for dizziness, numbness and headaches.  Psychiatric/Behavioral:  The patient is not nervous/anxious.         Past Medical History:  Diagnosis  Date   Asthma    Essential hypertension    GAD (generalized anxiety disorder)    Impacted cerumen of right ear 03/13/2018    Social History   Socioeconomic History   Marital status: Widowed    Spouse name: Not on file   Number of children: Not on file   Years of education: Not on file   Highest education level: Not on file  Occupational History   Not on file  Tobacco Use   Smoking status: Former    Packs/day: 0.50    Types: Cigarettes   Smokeless tobacco: Never  Substance and Sexual Activity   Alcohol use: No   Drug use: No   Sexual activity: Not Currently  Other Topics Concern   Not on file  Social History Narrative   Widowed.   Retired, once worked in Physiological scientist.   Enjoys crocheting, making cards.     Social Determinants of Health   Financial Resource Strain: Low Risk    Difficulty of Paying Living Expenses: Not hard at all  Food Insecurity: No Food Insecurity   Worried About Programme researcher, broadcasting/film/video in the Last Year: Never true   Ran Out of Food in the Last Year: Never true  Transportation Needs: No Transportation Needs   Lack of Transportation (Medical): No   Lack of Transportation (Non-Medical): No  Physical Activity: Inactive   Days of Exercise per Week: 0 days   Minutes of Exercise per Session: 0 min  Stress: No Stress Concern Present   Feeling of Stress : Not at all  Social Connections: Not on file  Intimate Partner Violence: Not  At Risk   Fear of Current or Ex-Partner: No   Emotionally Abused: No   Physically Abused: No   Sexually Abused: No    Past Surgical History:  Procedure Laterality Date   CHOLECYSTECTOMY  2000   TONSILLECTOMY      Family History  Problem Relation Age of Onset   Hypertension Mother    Diabetes Mother    Hypertension Father    Stroke Father    COPD Sister    Cancer Sister    Hypertension Sister    Hypertension Sister     Allergies  Allergen Reactions   Amlodipine     Ankle edema, shortness of breath,  dizziness   Hctz [Hydrochlorothiazide] Other (See Comments)    Dry mouth, fatigue   Lisinopril     Current Outpatient Medications on File Prior to Visit  Medication Sig Dispense Refill   Cholecalciferol (VITAMIN D3) 50 MCG (2000 UT) TABS Take 1 tablet by mouth.     fluticasone furoate-vilanterol (BREO ELLIPTA) 100-25 MCG/ACT AEPB Inhale 1 puff into the lungs daily. 3 each 0   hydrALAZINE (APRESOLINE) 50 MG tablet Take 1 tablet (50 mg total) by mouth every evening. For blood pressure 90 tablet 3   hydrOXYzine (ATARAX/VISTARIL) 10 MG tablet Take 1 tablet by mouth once daily as needed for anxiety. 30 tablet 0   magnesium gluconate (MAGONATE) 500 MG tablet Take 500 mg by mouth daily.     No current facility-administered medications on file prior to visit.    BP (!) 164/90    Pulse (!) 106    Temp 98.6 F (37 C) (Temporal)    Ht 5\' 8"  (1.727 m)    Wt 219 lb (99.3 kg)    SpO2 96%    BMI 33.30 kg/m  Objective:   Physical Exam HENT:     Right Ear: Tympanic membrane and ear canal normal.     Left Ear: Tympanic membrane and ear canal normal.     Nose: Nose normal.  Eyes:     Conjunctiva/sclera: Conjunctivae normal.     Pupils: Pupils are equal, round, and reactive to light.  Neck:     Thyroid: No thyromegaly.  Cardiovascular:     Rate and Rhythm: Normal rate and regular rhythm.     Heart sounds: No murmur heard. Pulmonary:     Effort: Pulmonary effort is normal.     Breath sounds: Normal breath sounds. No rales.  Abdominal:     General: Bowel sounds are normal.     Palpations: Abdomen is soft.     Tenderness: There is no abdominal tenderness.  Musculoskeletal:        General: Normal range of motion.     Cervical back: Neck supple.  Lymphadenopathy:     Cervical: No cervical adenopathy.  Skin:    General: Skin is warm and dry.     Findings: No rash.  Neurological:     Mental Status: She is alert and oriented to person, place, and time.     Cranial Nerves: No cranial nerve  deficit.     Deep Tendon Reflexes: Reflexes are normal and symmetric.  Psychiatric:        Mood and Affect: Mood normal.          Assessment & Plan:      This visit occurred during the SARS-CoV-2 public health emergency.  Safety protocols were in place, including screening questions prior to the visit, additional usage of staff PPE, and extensive cleaning of  exam room while observing appropriate contact time as indicated for disinfecting solutions.

## 2021-08-23 NOTE — Assessment & Plan Note (Signed)
Discussed the importance of a healthy diet and regular exercise in order for weight loss, and to reduce the risk of further co-morbidity. ? ?Repeat A1C pending. ?

## 2021-08-23 NOTE — Assessment & Plan Note (Signed)
Controlled.  Continue Breo Ellipta 100-25 mcg daily. Continue albuterol inhaler PRN.  Recommended lung cancer screening given her personal history of tobacco abuse and family history of lung cancer in her sister. She declines despite strong recommendations.

## 2021-08-23 NOTE — Patient Instructions (Signed)
Stop by the lab prior to leaving today. I will notify you of your results once received.   Monitor your blood pressure at home, notify me if you see readings consistently at or above 140 on top and/or 90 on bottom.  It was a pleasure to see you today!  Preventive Care 47 Years and Older, Female Preventive care refers to lifestyle choices and visits with your health care provider that can promote health and wellness. Preventive care visits are also called wellness exams. What can I expect for my preventive care visit? Counseling Your health care provider may ask you questions about your: Medical history, including: Past medical problems. Family medical history. Pregnancy and menstrual history. History of falls. Current health, including: Memory and ability to understand (cognition). Emotional well-being. Home life and relationship well-being. Sexual activity and sexual health. Lifestyle, including: Alcohol, nicotine or tobacco, and drug use. Access to firearms. Diet, exercise, and sleep habits. Work and work Statistician. Sunscreen use. Safety issues such as seatbelt and bike helmet use. Physical exam Your health care provider will check your: Height and weight. These may be used to calculate your BMI (body mass index). BMI is a measurement that tells if you are at a healthy weight. Waist circumference. This measures the distance around your waistline. This measurement also tells if you are at a healthy weight and may help predict your risk of certain diseases, such as type 2 diabetes and high blood pressure. Heart rate and blood pressure. Body temperature. Skin for abnormal spots. What immunizations do I need? Vaccines are usually given at various ages, according to a schedule. Your health care provider will recommend vaccines for you based on your age, medical history, and lifestyle or other factors, such as travel or where you work. What tests do I need? Screening Your health  care provider may recommend screening tests for certain conditions. This may include: Lipid and cholesterol levels. Hepatitis C test. Hepatitis B test. HIV (human immunodeficiency virus) test. STI (sexually transmitted infection) testing, if you are at risk. Lung cancer screening. Colorectal cancer screening. Diabetes screening. This is done by checking your blood sugar (glucose) after you have not eaten for a while (fasting). Mammogram. Talk with your health care provider about how often you should have regular mammograms. BRCA-related cancer screening. This may be done if you have a family history of breast, ovarian, tubal, or peritoneal cancers. Bone density scan. This is done to screen for osteoporosis. Talk with your health care provider about your test results, treatment options, and if necessary, the need for more tests. Follow these instructions at home: Eating and drinking  Eat a diet that includes fresh fruits and vegetables, whole grains, lean protein, and low-fat dairy products. Limit your intake of foods with high amounts of sugar, saturated fats, and salt. Take vitamin and mineral supplements as recommended by your health care provider. Do not drink alcohol if your health care provider tells you not to drink. If you drink alcohol: Limit how much you have to 0-1 drink a day. Know how much alcohol is in your drink. In the [place] one drink equals one 12 oz bottle of beer (355 mL), one 5 oz glass of wine (148 mL), or one 1 oz glass of hard liquor (44 mL). Lifestyle Brush your teeth every morning and night with fluoride toothpaste. Floss one time each day. Exercise for at least 30 minutes 5 or more days each week. Do not use any products that contain nicotine or tobacco. These products  include cigarettes, chewing tobacco, and vaping devices, such as e-cigarettes. If you need help quitting, ask your health care provider. Do not use drugs. If you are sexually active, practice safe  sex. Use a condom or other form of protection in order to prevent STIs. Take aspirin only as told by your health care provider. Make sure that you understand how much to take and what form to take. Work with your health care provider to find out whether it is safe and beneficial for you to take aspirin daily. Ask your health care provider if you need to take a cholesterol-lowering medicine (statin). Find healthy ways to manage stress, such as: Meditation, yoga, or listening to music. Journaling. Talking to a trusted person. Spending time with friends and family. Minimize exposure to UV radiation to reduce your risk of skin cancer. Safety Always wear your seat belt while driving or riding in a vehicle. Do not drive: If you have been drinking alcohol. Do not ride with someone who has been drinking. When you are tired or distracted. While texting. If you have been using any mind-altering substances or drugs. Wear a helmet and other protective equipment during sports activities. If you have firearms in your house, make sure you follow all gun safety procedures. What's next? Visit your health care provider once a year for an annual wellness visit. Ask your health care provider how often you should have your eyes and teeth checked. Stay up to date on all vaccines. This information is not intended to replace advice given to you by your health care provider. Make sure you discuss any questions you have with your health care provider. Document Revised: 12/21/2020 Document Reviewed: 12/21/2020 Elsevier Patient Education  [place].

## 2021-08-23 NOTE — Assessment & Plan Note (Signed)
Controlled.  Continue hydroxyzine 10 mg PRN. 

## 2021-08-23 NOTE — Assessment & Plan Note (Signed)
Declines all immunizations despite recommendations. Declines mammogram, bone density scan, colonoscopy, and lung cancer screening despite strong recommendations.  Discussed the importance of a healthy diet and regular exercise in order for weight loss, and to reduce the risk of further co-morbidity.  Exam today stable. Labs pending.

## 2021-08-23 NOTE — Assessment & Plan Note (Signed)
Above goal today, also on recheck. Historically has elevated BP in our office with improved home readings.  She will start monitoring BP at home and report if readings are consistently at or above 140/90.  Continue losartan 100 mg daily. Continue hydralazine 50 mg daily.   CMP pending.

## 2021-08-24 LAB — HEPATITIS C ANTIBODY
Hepatitis C Ab: NONREACTIVE
SIGNAL TO CUT-OFF: <0.02 (ref <1.00)

## 2021-08-31 NOTE — Progress Notes (Signed)
Subjective:   Jennifer Cannon is a 71 y.o. female who presents for Medicare Annual (Subsequent) preventive examination.  I connected with Kaylyn Layer today by telephone and verified that I am speaking with the correct person using two identifiers. Location patient: home Location provider: work Persons participating in the virtual visit: patient, Marine scientist.    I discussed the limitations, risks, security and privacy concerns of performing an evaluation and management service by telephone and the availability of in person appointments. I also discussed with the patient that there may be a patient responsible charge related to this service. The patient expressed understanding and verbally consented to this telephonic visit.    Interactive audio and video telecommunications were attempted between this provider and patient, however failed, due to patient having technical difficulties OR patient did not have access to video capability.  We continued and completed visit with audio only.  Some vital signs may be absent or patient reported.   Time Spent with patient on telephone encounter: 15 minutes  Review of Systems     Cardiac Risk Factors include: advanced age (>78men, >26 women);hypertension     Objective:    Today's Vitals   09/01/21 1156  Weight: 219 lb (99.3 kg)  Height: 5\' 8"  (1.727 m)   Body mass index is 33.3 kg/m.  Advanced Directives 09/01/2021 08/31/2020 08/26/2019  Does Patient Have a Medical Advance Directive? Yes No Yes  Type of Paramedic of [place];Living will - [place];Living will  Does patient want to make changes to medical advance directive? Yes (MAU/Ambulatory/Procedural Areas - Information given) - -  Copy of [place] in Chart? - - No - copy requested  Would patient like information on creating a medical advance directive? - No - Patient declined -    Current Medications (verified) Outpatient  Encounter Medications as of 09/01/2021  Medication Sig   albuterol (VENTOLIN HFA) 108 (90 Base) MCG/ACT inhaler Inhale 1-2 puffs into the lungs every 6 (six) hours as needed for wheezing or shortness of breath.   Cholecalciferol (VITAMIN D3) 50 MCG (2000 UT) TABS Take 1 tablet by mouth.   fluticasone furoate-vilanterol (BREO ELLIPTA) 100-25 MCG/ACT AEPB Inhale 1 puff into the lungs daily.   hydrALAZINE (APRESOLINE) 50 MG tablet Take 1 tablet (50 mg total) by mouth every evening. For blood pressure   hydrOXYzine (ATARAX/VISTARIL) 10 MG tablet Take 1 tablet by mouth once daily as needed for anxiety.   losartan (COZAAR) 100 MG tablet Take 1 tablet (100 mg total) by mouth daily. For blood pressure.   magnesium gluconate (MAGONATE) 500 MG tablet Take 500 mg by mouth daily.   No facility-administered encounter medications on file as of 09/01/2021.    Allergies (verified) Amlodipine, Hctz [hydrochlorothiazide], and Lisinopril   History: Past Medical History:  Diagnosis Date   Asthma    Essential hypertension    GAD (generalized anxiety disorder)    Impacted cerumen of right ear 03/13/2018   Past Surgical History:  Procedure Laterality Date   CHOLECYSTECTOMY  2000   TONSILLECTOMY     Family History  Problem Relation Age of Onset   Hypertension Mother    Diabetes Mother    Hypertension Father    Stroke Father    COPD Sister    Cancer Sister    Hypertension Sister    Hypertension Sister    Social History   Socioeconomic History   Marital status: Widowed    Spouse name: Not on file  Number of children: Not on file   Years of education: Not on file   Highest education level: Not on file  Occupational History   Not on file  Tobacco Use   Smoking status: Former    Packs/day: 0.50    Types: Cigarettes   Smokeless tobacco: Never  Substance and Sexual Activity   Alcohol use: No   Drug use: No   Sexual activity: Not Currently  Other Topics Concern   Not on file  Social History  Narrative   Widowed.   Retired, once worked in Marketing executive.   Enjoys crocheting, making cards.     Social Determinants of Health   Financial Resource Strain: Low Risk    Difficulty of Paying Living Expenses: Not hard at all  Food Insecurity: No Food Insecurity   Worried About Charity fundraiser in the Last Year: Never true   [place] in the Last Year: Never true  Transportation Needs: No Transportation Needs   Lack of Transportation (Medical): No   Lack of Transportation (Non-Medical): No  Physical Activity: Inactive   Days of Exercise per Week: 0 days   Minutes of Exercise per Session: 0 min  Stress: No Stress Concern Present   Feeling of Stress : Not at all  Social Connections: Moderately Integrated   Frequency of Communication with Friends and Family: More than three times a week   Frequency of Social Gatherings with Friends and Family: More than three times a week   Attends Religious Services: More than 4 times per year   Active Member of [employer] or Organizations: Yes   Attends Archivist Meetings: More than 4 times per year   Marital Status: Widowed    Tobacco Counseling Counseling given: Not Answered   Clinical Intake:  Pre-visit preparation completed: Yes  Pain : No/denies pain     BMI - recorded: 33.3 Nutritional Status: BMI > 30  Obese Nutritional Risks: None Diabetes: No  How often do you need to have someone help you when you read instructions, pamphlets, or other written materials from your doctor or pharmacy?: 1 - Never  Diabetic? No  Interpreter Needed?: No  Information entered by :: Orrin Brigham LPN   Activities of Daily Living In your present state of health, do you have any difficulty performing the following activities: 09/01/2021  Hearing? N  Vision? N  Difficulty concentrating or making decisions? N  Walking or climbing stairs? N  Dressing or bathing? N  Doing errands, shopping? N  Preparing Food and eating ?  N  Using the Toilet? N  In the past six months, have you accidently leaked urine? N  Do you have problems with loss of bowel control? N  Managing your Medications? N  Managing your Finances? N  Housekeeping or managing your Housekeeping? N  Some recent data might be hidden    Patient Care Team: Pleas Koch, NP as PCP - General (Internal Medicine)  Indicate any recent Medical Services you may have received from other than [facility] providers in the past year (date may be approximate).     Assessment:   This is a routine wellness examination for [place].  Hearing/Vision screen Hearing Screening - Comments:: No issues Vision Screening - Comments:: Last exam over a year ago, plans to make an appointment  Dietary issues and exercise activities discussed: Current Exercise Habits: The patient does not participate in regular exercise at present   Goals Addressed  This Visit's Progress    Patient Stated       Would like to eat healthier and lose weight       Depression Screen PHQ 2/9 Scores 09/01/2021 08/31/2020 08/26/2019 09/04/2018  PHQ - 2 Score 0 0 0 0  PHQ- 9 Score - 0 0 -    Fall Risk Fall Risk  09/01/2021 08/31/2020 08/26/2019 06/01/2019 12/25/2017  Falls in the past year? 0 0 0 0 No  Comment - - - Emmi Telephone Survey: data to providers prior to load -  Number falls in past yr: 0 0 0 - -  Injury with Fall? 0 0 0 - -  Risk for fall due to : No Fall Risks Medication side effect Medication side effect - -  Follow up Falls prevention discussed Falls evaluation completed;Falls prevention discussed Falls evaluation completed;Falls prevention discussed - -    FALL RISK PREVENTION PERTAINING TO THE HOME:  Any stairs in or around the home? Yes  If so, are there any without handrails? Yes  Home free of loose throw rugs in walkways, pet beds, electrical cords, etc? Yes  Adequate lighting in your home to reduce risk of falls? Yes   ASSISTIVE DEVICES UTILIZED TO  PREVENT FALLS:  Life alert? No  Use of a cane, walker or w/c? No  Grab bars in the bathroom? No  Shower chair or bench in shower? Yes  Elevated toilet seat or a handicapped toilet? No   TIMED UP AND GO:  Was the test performed? No .    Cognitive Function: Normal cognitive status assessed by this Nurse Health Advisor. No abnormalities found.   MMSE - Mini Mental State Exam 08/31/2020 08/26/2019  Orientation to time 5 5  Orientation to Place 5 5  Registration 3 3  Attention/ Calculation 5 5  Recall 3 3  Language- repeat 1 1        Immunizations Immunization History  Administered Date(s) Administered   PFIZER(Purple Top)SARS-COV-2 Vaccination 05/06/2020, 05/22/2020    TDAP status: Due, Education has been provided regarding the importance of this vaccine. Advised may receive this vaccine at local pharmacy or Health Dept. Aware to provide a copy of the vaccination record if obtained from local pharmacy or Health Dept. Verbalized acceptance and understanding.  Flu Vaccine status: Declined, Education has been provided regarding the importance of this vaccine but patient still declined. Advised may receive this vaccine at local pharmacy or Health Dept. Aware to provide a copy of the vaccination record if obtained from local pharmacy or Health Dept. Verbalized acceptance and understanding.  Pneumococcal vaccine status: Declined,  Education has been provided regarding the importance of this vaccine but patient still declined. Advised may receive this vaccine at local pharmacy or Health Dept. Aware to provide a copy of the vaccination record if obtained from local pharmacy or Health Dept. Verbalized acceptance and understanding.   Covid-19 vaccine status: Declined, Education has been provided regarding the importance of this vaccine but patient still declined. Advised may receive this vaccine at local pharmacy or Health Dept.or vaccine clinic. Aware to provide a copy of the vaccination  record if obtained from local pharmacy or Health Dept. Verbalized acceptance and understanding.  Qualifies for Shingles Vaccine? Yes   Zostavax completed No   Shingrix Completed?: No.    Education has been provided regarding the importance of this vaccine. Patient has been advised to call insurance company to determine out of pocket expense if they have not yet received this vaccine. Advised may  also receive vaccine at local pharmacy or Health Dept. Verbalized acceptance and understanding.  Screening Tests Health Maintenance  Topic Date Due   Zoster Vaccines- Shingrix (1 of 2) 11/20/2021 (Originally 04/13/2001)   Pneumonia Vaccine 53+ Years old (1 - PCV) 08/23/2022 (Originally 04/13/1957)   INFLUENZA VACCINE  08/30/2023 (Originally 02/06/2021)   MAMMOGRAM  08/30/2023 (Originally 04/13/2001)   DEXA SCAN  08/30/2023 (Originally 04/13/2016)   COLONOSCOPY (Pts 45-32yrs Insurance coverage will need to be confirmed)  08/30/2023 (Originally 04/13/1996)   TETANUS/TDAP  08/30/2023 (Originally 04/13/1970)   Hepatitis C Screening  Completed   HPV VACCINES  Aged Out   COVID-19 Vaccine  Discontinued    Health Maintenance  There are no preventive care reminders to display for this patient.  Colorectal Cancer screening: Patient declined today, plans to discuss with PCP when decided  Mammogram status: Patient declined today, plans to discuss with PCP when decided  Bone Density Status: Patient declined today, plans to discuss with PCP when decided  Lung Cancer Screening: (Low Dose CT Chest recommended if Age 62-80 years, 30 pack-year currently smoking OR have quit w/in 15years.) does not qualify.     Additional Screening:  Hepatitis C Screening: does qualify  Vision Screening: Recommended annual ophthalmology exams for early detection of glaucoma and other disorders of the eye. Is the patient up to date with their annual eye exam?  No  Who is the provider or what is the name of the office in which  the patient attends annual eye exams? Provider Information Unavailable    Dental Screening: Recommended annual dental exams for proper oral hygiene  Community Resource Referral / Chronic Care Management: CRR required this visit?  No   CCM required this visit?  No      Plan:     I have personally reviewed and noted the following in the patients chart:   Medical and social history Use of alcohol, tobacco or illicit drugs  Current medications and supplements including opioid prescriptions.  Functional ability and status Nutritional status Physical activity Advanced directives List of other physicians Hospitalizations, surgeries, and ER visits in previous 12 months Vitals Screenings to include cognitive, depression, and falls Referrals and appointments  In addition, I have reviewed and discussed with patient certain preventive protocols, quality metrics, and best practice recommendations. A written personalized care plan for preventive services as well as general preventive health recommendations were provided to patient.   Due to this being a telephonic visit, the after visit summary with patients personalized plan was offered to patient via mail or my-chart. Patient would like to access on my-chart.    Loma Messing, LPN   [id number]   Nurse Health Advisor  Nurse Notes: none

## 2021-09-01 ENCOUNTER — Ambulatory Visit (INDEPENDENT_AMBULATORY_CARE_PROVIDER_SITE_OTHER): Payer: PPO

## 2021-09-01 VITALS — Ht 68.0 in | Wt 219.0 lb

## 2021-09-01 DIAGNOSIS — Z0000 Encounter for general adult medical examination without abnormal findings: Secondary | ICD-10-CM

## 2021-09-01 NOTE — Patient Instructions (Signed)
Ms. Jennifer Cannon , Thank you for taking time to complete your Medicare Wellness Visit. I appreciate your ongoing commitment to your health goals. Please review the following plan we discussed and let me know if I can assist you in the future.   Screening recommendations/referrals: Colonoscopy: Per our conversation you plans to discuss with PCP if you change your mind Mammogram: Per our conversation you plans to discuss with PCP if you change your mind Bone Density: Per our conversation you plans to discuss with PCP if you change your mind Recommended yearly ophthalmology/optometry visit for glaucoma screening and checkup Recommended yearly dental visit for hygiene and checkup  Vaccinations: Influenza vaccine: Per our conversation you plans to discuss with PCP if you change your mind Pneumococcal vaccine: Per our conversation you plans to discuss with PCP if you change your mind Tdap vaccine: Per our conversation you plans to discuss with PCP if you change your mind Shingles vaccine: Per our conversation you plans to discuss with PCP if you change your mind   Covid-19:Per our conversation you plans to discuss with PCP if you change your mind  Advanced directives: Please bring a copy of Living Will and/or Healthcare Power of Attorney for your chart.   Conditions/risks identified: see problem list  Next appointment: Follow up in one year for your annual wellness visit 09/03/22 @ 12:00pm, this will be a telephone visit   Preventive Care 71 Years and Older, Female Preventive care refers to lifestyle choices and visits with your health care provider that can promote health and wellness. What does preventive care include? A yearly physical exam. This is also called an annual well check. Dental exams once or twice a year. Routine eye exams. Ask your health care provider how often you should have your eyes checked. Personal lifestyle choices, including: Daily care of your teeth and gums. Regular  physical activity. Eating a healthy diet. Avoiding tobacco and drug use. Limiting alcohol use. Practicing safe sex. Taking low-dose aspirin every day. Taking vitamin and mineral supplements as recommended by your health care provider. What happens during an annual well check? The services and screenings done by your health care provider during your annual well check will depend on your age, overall health, lifestyle risk factors, and family history of disease. Counseling  Your health care provider may ask you questions about your: Alcohol use. Tobacco use. Drug use. Emotional well-being. Home and relationship well-being. Sexual activity. Eating habits. History of falls. Memory and ability to understand (cognition). Work and work Astronomer. Reproductive health. Screening  You may have the following tests or measurements: Height, weight, and BMI. Blood pressure. Lipid and cholesterol levels. These may be checked every 5 years, or more frequently if you are over 71 years old. Skin check. Lung cancer screening. You may have this screening every year starting at age 71 if you have a 30-pack-year history of smoking and currently smoke or have quit within the past 15 years. Fecal occult blood test (FOBT) of the stool. You may have this test every year starting at age 71. Flexible sigmoidoscopy or colonoscopy. You may have a sigmoidoscopy every 5 years or a colonoscopy every 10 years starting at age 71. Hepatitis C blood test. Hepatitis B blood test. Sexually transmitted disease (STD) testing. Diabetes screening. This is done by checking your blood sugar (glucose) after you have not eaten for a while (fasting). You may have this done every 1-3 years. Bone density scan. This is done to screen for osteoporosis. You may have  this done starting at age 71. Mammogram. This may be done every 1-2 years. Talk to your health care provider about how often you should have regular mammograms. Talk  with your health care provider about your test results, treatment options, and if necessary, the need for more tests. Vaccines  Your health care provider may recommend certain vaccines, such as: Influenza vaccine. This is recommended every year. Tetanus, diphtheria, and acellular pertussis (Tdap, Td) vaccine. You may need a Td booster every 10 years. Zoster vaccine. You may need this after age 71. Pneumococcal 13-valent conjugate (PCV13) vaccine. One dose is recommended after age 71. Pneumococcal polysaccharide (PPSV23) vaccine. One dose is recommended after age 71. Talk to your health care provider about which screenings and vaccines you need and how often you need them. This information is not intended to replace advice given to you by your health care provider. Make sure you discuss any questions you have with your health care provider. Document Released: 07/22/2015 Document Revised: 03/14/2016 Document Reviewed: 04/26/2015 Elsevier Interactive Patient Education  2017 [employer].  Fall Prevention in the Home Falls can cause injuries. They can happen to people of all ages. There are many things you can do to make your home safe and to help prevent falls. What can I do on the outside of my home? Regularly fix the edges of walkways and driveways and fix any cracks. Remove anything that might make you trip as you walk through a door, such as a raised step or threshold. Trim any bushes or trees on the path to your home. Use bright outdoor lighting. Clear any walking paths of anything that might make someone trip, such as rocks or tools. Regularly check to see if handrails are loose or broken. Make sure that both sides of any steps have handrails. Any raised decks and porches should have guardrails on the edges. Have any leaves, snow, or ice cleared regularly. Use sand or salt on walking paths during winter. Clean up any spills in your garage right away. This includes oil or grease  spills. What can I do in the bathroom? Use night lights. Install grab bars by the toilet and in the tub and shower. Do not use towel bars as grab bars. Use non-skid mats or decals in the tub or shower. If you need to sit down in the shower, use a plastic, non-slip stool. Keep the floor dry. Clean up any water that spills on the floor as soon as it happens. Remove soap buildup in the tub or shower regularly. Attach bath mats securely with double-sided non-slip rug tape. Do not have throw rugs and other things on the floor that can make you trip. What can I do in the bedroom? Use night lights. Make sure that you have a light by your bed that is easy to reach. Do not use any sheets or blankets that are too big for your bed. They should not hang down onto the floor. Have a firm chair that has side arms. You can use this for support while you get dressed. Do not have throw rugs and other things on the floor that can make you trip. What can I do in the kitchen? Clean up any spills right away. Avoid walking on wet floors. Keep items that you use a lot in easy-to-reach places. If you need to reach something above you, use a strong step stool that has a grab bar. Keep electrical cords out of the way. Do not use floor polish or  wax that makes floors slippery. If you must use wax, use non-skid floor wax. Do not have throw rugs and other things on the floor that can make you trip. What can I do with my stairs? Do not leave any items on the stairs. Make sure that there are handrails on both sides of the stairs and use them. Fix handrails that are broken or loose. Make sure that handrails are as long as the stairways. Check any carpeting to make sure that it is firmly attached to the stairs. Fix any carpet that is loose or worn. Avoid having throw rugs at the top or bottom of the stairs. If you do have throw rugs, attach them to the floor with carpet tape. Make sure that you have a light switch at the  top of the stairs and the bottom of the stairs. If you do not have them, ask someone to add them for you. What else can I do to help prevent falls? Wear shoes that: Do not have high heels. Have rubber bottoms. Are comfortable and fit you well. Are closed at the toe. Do not wear sandals. If you use a stepladder: Make sure that it is fully opened. Do not climb a closed stepladder. Make sure that both sides of the stepladder are locked into place. Ask someone to hold it for you, if possible. Clearly mark and make sure that you can see: Any grab bars or handrails. First and last steps. Where the edge of each step is. Use tools that help you move around (mobility aids) if they are needed. These include: Canes. Walkers. Scooters. Crutches. Turn on the lights when you go into a dark area. Replace any light bulbs as soon as they burn out. Set up your furniture so you have a clear path. Avoid moving your furniture around. If any of your floors are uneven, fix them. If there are any pets around you, be aware of where they are. Review your medicines with your doctor. Some medicines can make you feel dizzy. This can increase your chance of falling. Ask your doctor what other things that you can do to help prevent falls. This information is not intended to replace advice given to you by your health care provider. Make sure you discuss any questions you have with your health care provider. Document Released: 04/21/2009 Document Revised: 12/01/2015 Document Reviewed: 07/30/2014 Elsevier Interactive Patient Education  2017 [employer].

## 2021-09-08 ENCOUNTER — Other Ambulatory Visit: Payer: Self-pay | Admitting: Primary Care

## 2021-09-08 DIAGNOSIS — I10 Essential (primary) hypertension: Secondary | ICD-10-CM

## 2021-10-16 ENCOUNTER — Other Ambulatory Visit: Payer: Self-pay | Admitting: Primary Care

## 2021-10-16 DIAGNOSIS — J4520 Mild intermittent asthma, uncomplicated: Secondary | ICD-10-CM

## 2021-10-16 DIAGNOSIS — J439 Emphysema, unspecified: Secondary | ICD-10-CM

## 2021-11-02 IMAGING — DX DG CHEST 2V
2 series · 2 of 2 positions shown · non-contrast
Comparison: 4999

CLINICAL DATA: Exertional dyspnea

EXAM:
CHEST - 2 VIEW

[chest pa]
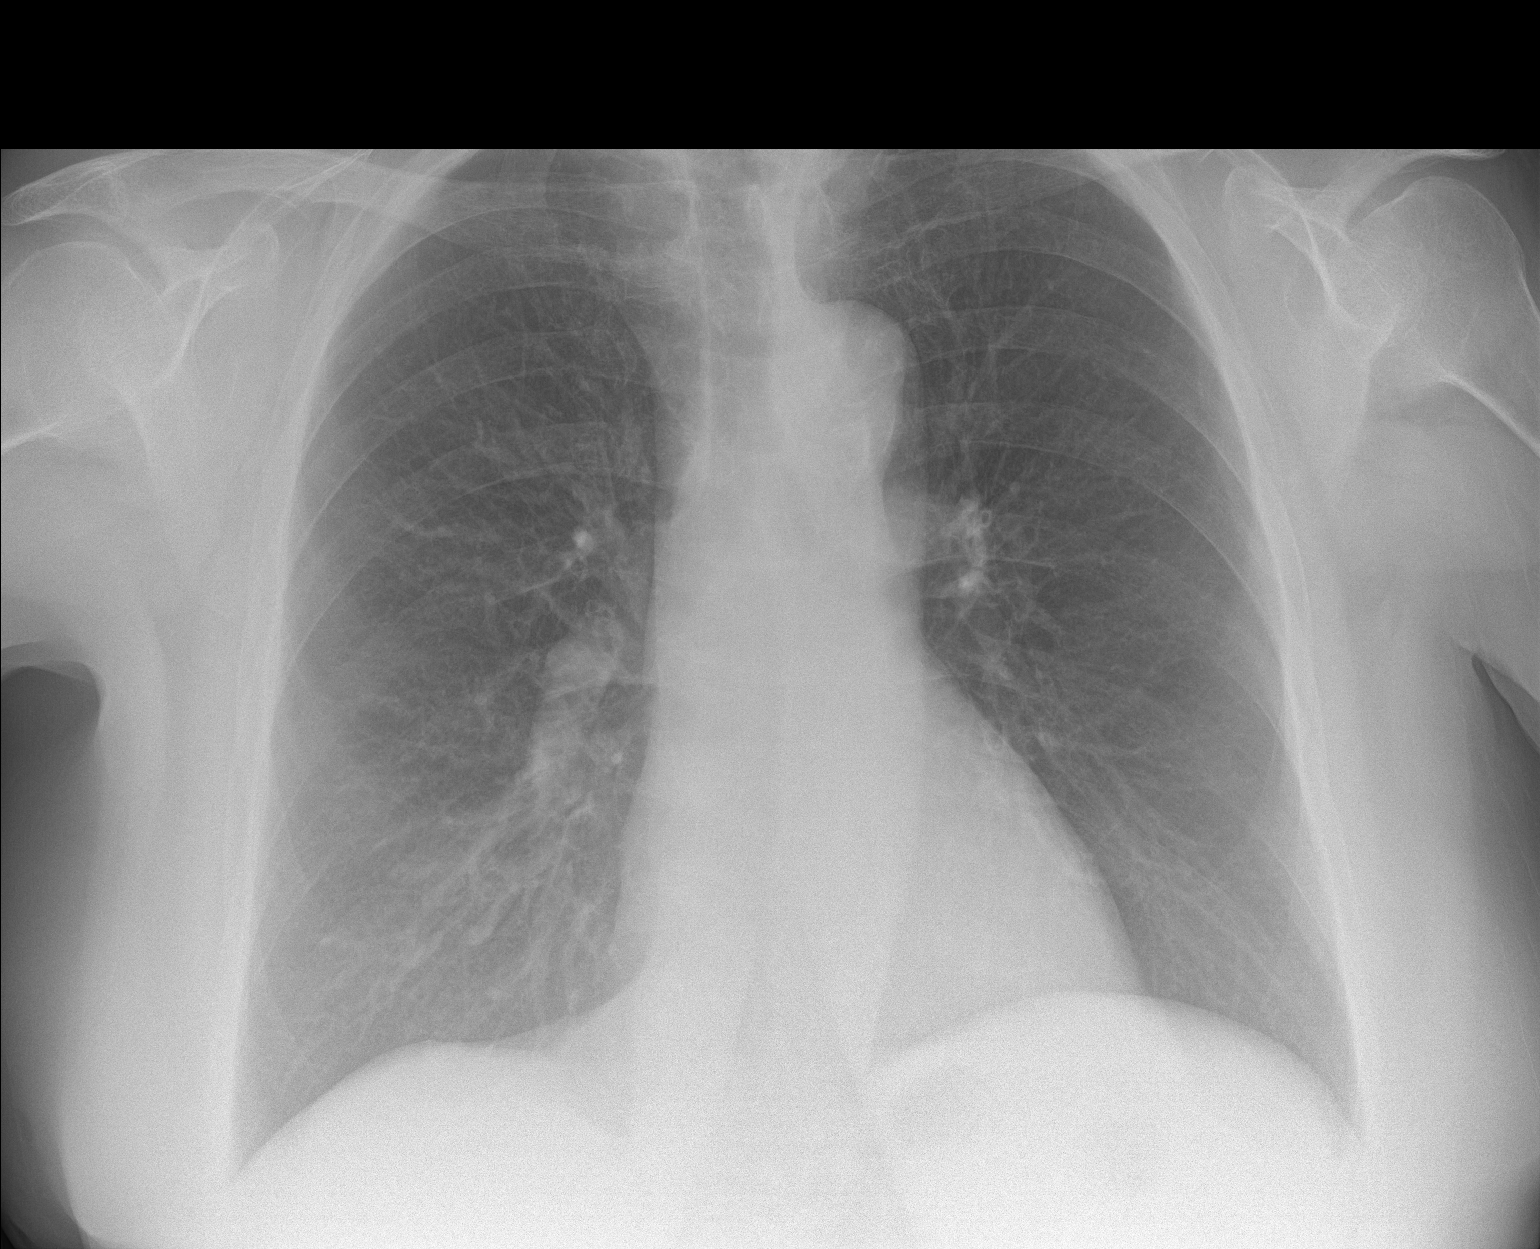

[chest lat]
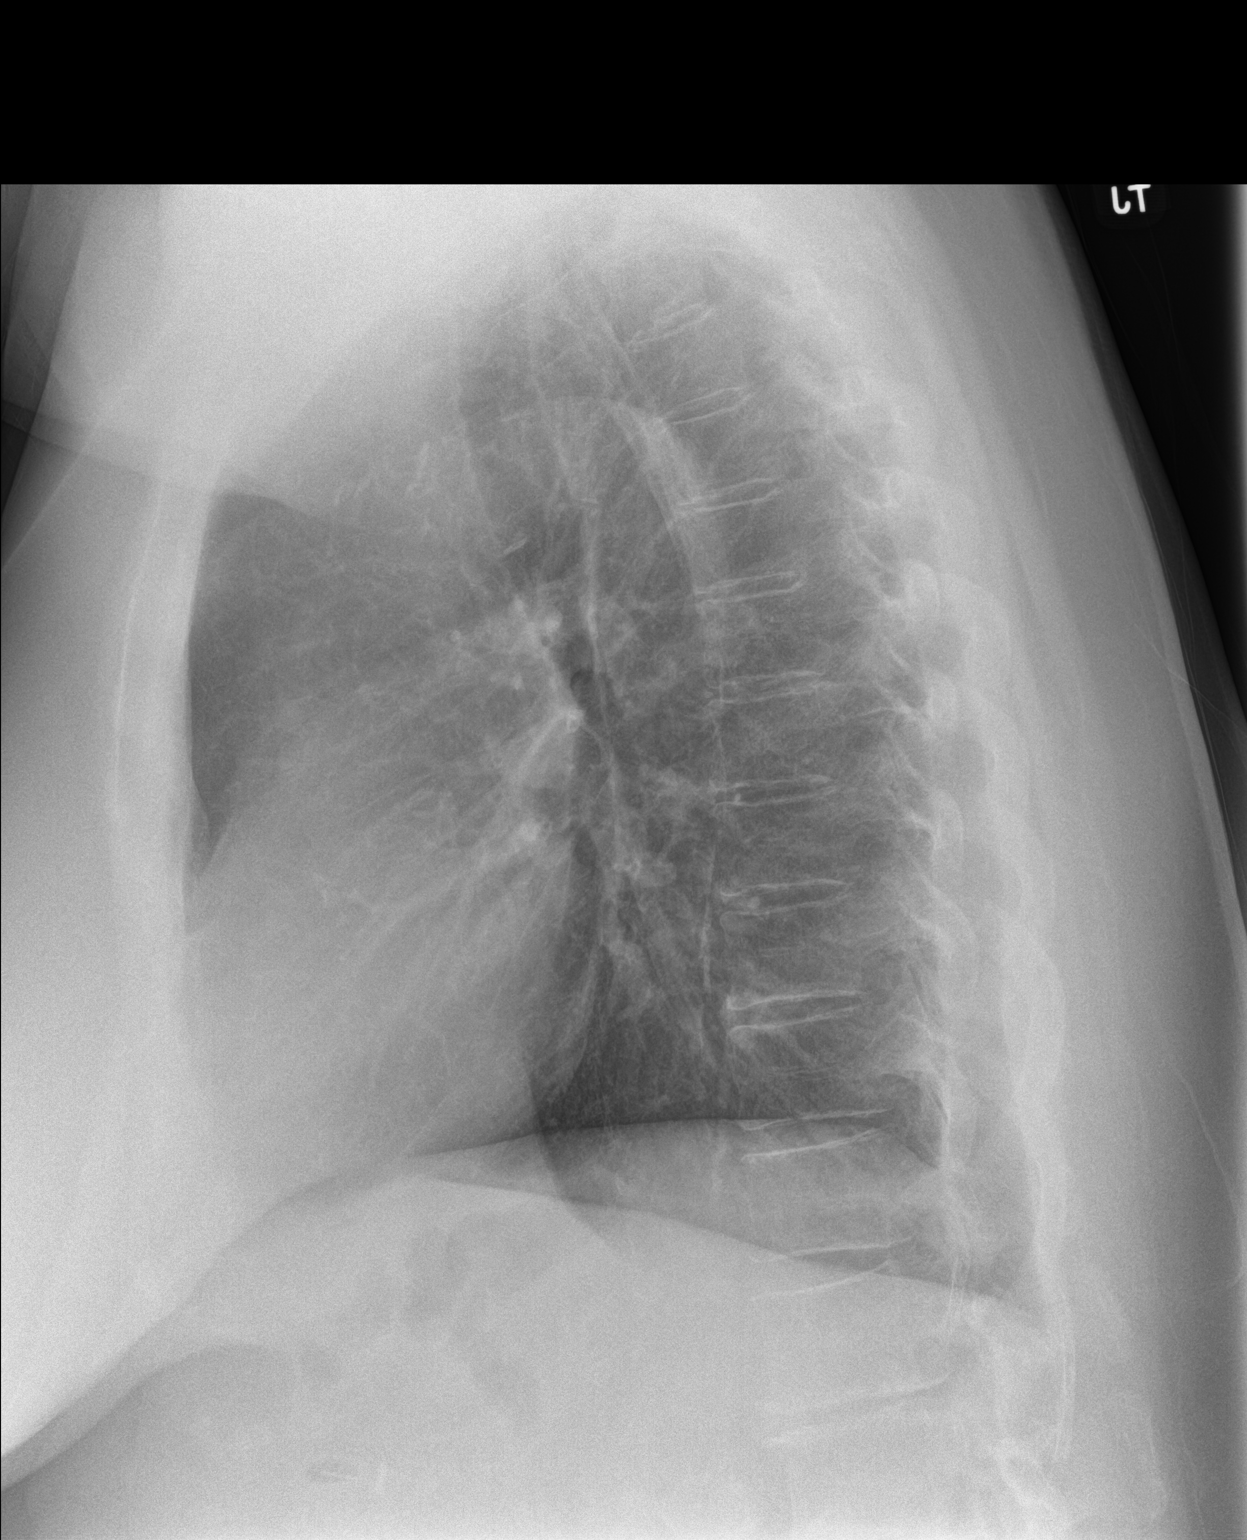

[2 of 2 positions shown; findings below may reference images not displayed]

FINDINGS: The heart size and mediastinal contours are within normal limits.
Both lungs are clear. Possible background emphysema. No pleural
effusion. The visualized skeletal structures are unremarkable.
IMPRESSION: No acute process in the chest.  Possible background emphysema.

## 2022-07-13 ENCOUNTER — Telehealth: Payer: Self-pay | Admitting: Primary Care

## 2022-07-13 DIAGNOSIS — J4520 Mild intermittent asthma, uncomplicated: Secondary | ICD-10-CM

## 2022-07-13 DIAGNOSIS — J439 Emphysema, unspecified: Secondary | ICD-10-CM

## 2022-07-13 MED ORDER — FLUTICASONE FUROATE-VILANTEROL 100-25 MCG/ACT IN AEPB: INHALATION_SPRAY | RESPIRATORY_TRACT | 1 refills | Status: DC

## 2022-07-13 NOTE — Telephone Encounter (Signed)
Prescription Request  07/13/2022  Is this a "Controlled Substance" medicine? No  LOV: Visit date not found  What is the name of the medication or equipment? fluticasone furoate-vilanterol (BREO ELLIPTA) 100-25 MCG/ACT AEPB  albuterol (VENTOLIN HFA) 108 (90 Base) MCG/ACT inhaler  Have you contacted your pharmacy to request a refill? No   Which pharmacy would you like this sent to?  [id number] [address], [place] [address] [place] [address] Phone: [phone] Fax: [phone]    Patient notified that their request is being sent to the clinical staff for review and that they should receive a response within 2 business days.   Please advise at Mobile [phone] (mobile)

## 2022-08-17 ENCOUNTER — Other Ambulatory Visit: Payer: Self-pay | Admitting: Primary Care

## 2022-08-17 DIAGNOSIS — I10 Essential (primary) hypertension: Secondary | ICD-10-CM

## 2022-08-26 ENCOUNTER — Other Ambulatory Visit: Payer: Self-pay | Admitting: Primary Care

## 2022-08-26 DIAGNOSIS — I10 Essential (primary) hypertension: Secondary | ICD-10-CM

## 2022-09-04 ENCOUNTER — Ambulatory Visit (INDEPENDENT_AMBULATORY_CARE_PROVIDER_SITE_OTHER): Payer: PPO

## 2022-09-04 VITALS — Ht 68.0 in | Wt 219.0 lb

## 2022-09-04 DIAGNOSIS — Z0000 Encounter for general adult medical examination without abnormal findings: Secondary | ICD-10-CM

## 2022-09-04 NOTE — Patient Instructions (Signed)
Ms. Jennifer Cannon , Thank you for taking time to come for your Medicare Wellness Visit. I appreciate your ongoing commitment to your health goals. Please review the following plan we discussed and let me know if I can assist you in the future.   These are the goals we discussed:  Goals      Patient Stated     08/26/2019, I will try to work on losing some weight soon.      Patient Stated     08/31/2020, I will maintain and continue medications as prescribed.      Patient Stated     Would like to eat healthier and lose weight     Patient Stated     Lose 15-20lbs.        This is a list of the screening recommended for you and due dates:  Health Maintenance  Topic Date Due   Pneumonia Vaccine (1 of 2 - PCV) Never done   DTaP/Tdap/Td vaccine (1 - Tdap) Never done   Zoster (Shingles) Vaccine (1 of 2) Never done   Flu Shot  08/30/2023*   Mammogram  08/30/2023*   DEXA scan (bone density measurement)  08/30/2023*   Colon Cancer Screening  08/30/2023*   Medicare Annual Wellness Visit  09/05/2023   Hepatitis C Screening: USPSTF Recommendation to screen - Ages 18-79 yo.  Completed   HPV Vaccine  Aged Out   COVID-19 Vaccine  Discontinued  *Topic was postponed. The date shown is not the original due date.    Advanced directives: Advance directive discussed with you today. Even though you declined this today, please call our office should you change your mind, and we can give you the proper paperwork for you to fill out.   Conditions/risks identified: Aim for 30 minutes of exercise or brisk walking, 6-8 glasses of water, and 5 servings of fruits and vegetables each day.   Next appointment: Follow up in one year for your annual wellness visit 09/09/2023 @ 2:45 via telephone.   Preventive Care 31 Years and Older, Female Preventive care refers to lifestyle choices and visits with your health care provider that can promote health and wellness. What does preventive care include? A yearly physical  exam. This is also called an annual well check. Dental exams once or twice a year. Routine eye exams. Ask your health care provider how often you should have your eyes checked. Personal lifestyle choices, including: Daily care of your teeth and gums. Regular physical activity. Eating a healthy diet. Avoiding tobacco and drug use. Limiting alcohol use. Practicing safe sex. Taking low-dose aspirin every day. Taking vitamin and mineral supplements as recommended by your health care provider. What happens during an annual well check? The services and screenings done by your health care provider during your annual well check will depend on your age, overall health, lifestyle risk factors, and family history of disease. Counseling  Your health care provider may ask you questions about your: Alcohol use. Tobacco use. Drug use. Emotional well-being. Home and relationship well-being. Sexual activity. Eating habits. History of falls. Memory and ability to understand (cognition). Work and work Statistician. Reproductive health. Screening  You may have the following tests or measurements: Height, weight, and BMI. Blood pressure. Lipid and cholesterol levels. These may be checked every 5 years, or more frequently if you are over 41 years old. Skin check. Lung cancer screening. You may have this screening every year starting at age 75 if you have a 30-pack-year history of smoking and  currently smoke or have quit within the past 15 years. Fecal occult blood test (FOBT) of the stool. You may have this test every year starting at age 61. Flexible sigmoidoscopy or colonoscopy. You may have a sigmoidoscopy every 5 years or a colonoscopy every 10 years starting at age 59. Hepatitis C blood test. Hepatitis B blood test. Sexually transmitted disease (STD) testing. Diabetes screening. This is done by checking your blood sugar (glucose) after you have not eaten for a while (fasting). You may have this  done every 1-3 years. Bone density scan. This is done to screen for osteoporosis. You may have this done starting at age 31. Mammogram. This may be done every 1-2 years. Talk to your health care provider about how often you should have regular mammograms. Talk with your health care provider about your test results, treatment options, and if necessary, the need for more tests. Vaccines  Your health care provider may recommend certain vaccines, such as: Influenza vaccine. This is recommended every year. Tetanus, diphtheria, and acellular pertussis (Tdap, Td) vaccine. You may need a Td booster every 10 years. Zoster vaccine. You may need this after age 45. Pneumococcal 13-valent conjugate (PCV13) vaccine. One dose is recommended after age 38. Pneumococcal polysaccharide (PPSV23) vaccine. One dose is recommended after age 22. Talk to your health care provider about which screenings and vaccines you need and how often you need them. This information is not intended to replace advice given to you by your health care provider. Make sure you discuss any questions you have with your health care provider. Document Released: 07/22/2015 Document Revised: 03/14/2016 Document Reviewed: 04/26/2015 Elsevier Interactive Patient Education  2017 [place] Prevention in the Home Falls can cause injuries. They can happen to people of all ages. There are many things you can do to make your home safe and to help prevent falls. What can I do on the outside of my home? Regularly fix the edges of walkways and driveways and fix any cracks. Remove anything that might make you trip as you walk through a door, such as a raised step or threshold. Trim any bushes or trees on the path to your home. Use bright outdoor lighting. Clear any walking paths of anything that might make someone trip, such as rocks or tools. Regularly check to see if handrails are loose or broken. Make sure that both sides of any steps have  handrails. Any raised decks and porches should have guardrails on the edges. Have any leaves, snow, or ice cleared regularly. Use sand or salt on walking paths during winter. Clean up any spills in your garage right away. This includes oil or grease spills. What can I do in the bathroom? Use night lights. Install grab bars by the toilet and in the tub and shower. Do not use towel bars as grab bars. Use non-skid mats or decals in the tub or shower. If you need to sit down in the shower, use a plastic, non-slip stool. Keep the floor dry. Clean up any water that spills on the floor as soon as it happens. Remove soap buildup in the tub or shower regularly. Attach bath mats securely with double-sided non-slip rug tape. Do not have throw rugs and other things on the floor that can make you trip. What can I do in the bedroom? Use night lights. Make sure that you have a light by your bed that is easy to reach. Do not use any sheets or blankets that are too  big for your bed. They should not hang down onto the floor. Have a firm chair that has side arms. You can use this for support while you get dressed. Do not have throw rugs and other things on the floor that can make you trip. What can I do in the kitchen? Clean up any spills right away. Avoid walking on wet floors. Keep items that you use a lot in easy-to-reach places. If you need to reach something above you, use a strong step stool that has a grab bar. Keep electrical cords out of the way. Do not use floor polish or wax that makes floors slippery. If you must use wax, use non-skid floor wax. Do not have throw rugs and other things on the floor that can make you trip. What can I do with my stairs? Do not leave any items on the stairs. Make sure that there are handrails on both sides of the stairs and use them. Fix handrails that are broken or loose. Make sure that handrails are as long as the stairways. Check any carpeting to make sure  that it is firmly attached to the stairs. Fix any carpet that is loose or worn. Avoid having throw rugs at the top or bottom of the stairs. If you do have throw rugs, attach them to the floor with carpet tape. Make sure that you have a light switch at the top of the stairs and the bottom of the stairs. If you do not have them, ask someone to add them for you. What else can I do to help prevent falls? Wear shoes that: Do not have high heels. Have rubber bottoms. Are comfortable and fit you well. Are closed at the toe. Do not wear sandals. If you use a stepladder: Make sure that it is fully opened. Do not climb a closed stepladder. Make sure that both sides of the stepladder are locked into place. Ask someone to hold it for you, if possible. Clearly mark and make sure that you can see: Any grab bars or handrails. First and last steps. Where the edge of each step is. Use tools that help you move around (mobility aids) if they are needed. These include: Canes. Walkers. Scooters. Crutches. Turn on the lights when you go into a dark area. Replace any light bulbs as soon as they burn out. Set up your furniture so you have a clear path. Avoid moving your furniture around. If any of your floors are uneven, fix them. If there are any pets around you, be aware of where they are. Review your medicines with your doctor. Some medicines can make you feel dizzy. This can increase your chance of falling. Ask your doctor what other things that you can do to help prevent falls. This information is not intended to replace advice given to you by your health care provider. Make sure you discuss any questions you have with your health care provider. Document Released: 04/21/2009 Document Revised: 12/01/2015 Document Reviewed: 07/30/2014 Elsevier Interactive Patient Education  2017 [name].

## 2022-09-04 NOTE — Progress Notes (Signed)
Subjective:   Jennifer Cannon is a 72 y.o. female who presents for Medicare Annual (Subsequent) preventive examination.  Review of Systems     Cardiac Risk Factors include: advanced age (>68mn, >>18women);hypertension;sedentary lifestyle     Objective:    Today's Vitals   09/04/22 1116  Weight: 219 lb (99.3 kg)  Height: '5\' 8"'$  (1.727 m)   Body mass index is 33.3 kg/m.     09/04/2022   11:28 AM 09/01/2021   11:58 AM 08/31/2020   11:54 AM 08/26/2019    2:01 PM  Advanced Directives  Does Patient Have a Medical Advance Directive? No Yes No Yes  Type of ACorporate treasurerof ADanforthLiving will  HKilbourneLiving will  Does patient want to make changes to medical advance directive?  Yes (MAU/Ambulatory/Procedural Areas - Information given)    Copy of HCloverportin Chart?    No - copy requested  Would patient like information on creating a medical advance directive? No - Patient declined  No - Patient declined     Current Medications (verified) Outpatient Encounter Medications as of 09/04/2022  Medication Sig   Cholecalciferol (VITAMIN D3) 50 MCG (2000 UT) TABS Take 1 tablet by mouth.   fluticasone furoate-vilanterol (BREO ELLIPTA) 100-25 MCG/ACT AEPB TAKE 1 PUFF BY MOUTH EVERY DAY   hydrALAZINE (APRESOLINE) 50 MG tablet TAKE 1 TABLET BY MOUTH EVERY EVENING FOR BLOOD PRESSURE   losartan (COZAAR) 100 MG tablet TAKE 1 TABLET BY MOUTH EVERY DAY FOR BLOOD PRESSURE   magnesium gluconate (MAGONATE) 500 MG tablet Take 500 mg by mouth daily.   OVER THE COUNTER MEDICATION Take 99 mg by mouth daily. Potassium   albuterol (VENTOLIN HFA) 108 (90 Base) MCG/ACT inhaler Inhale 1-2 puffs into the lungs every 6 (six) hours as needed for wheezing or shortness of breath. (Patient not taking: Reported on 09/04/2022)   hydrOXYzine (ATARAX/VISTARIL) 10 MG tablet Take 1 tablet by mouth once daily as needed for anxiety. (Patient not taking: Reported on  09/04/2022)   No facility-administered encounter medications on file as of 09/04/2022.    Allergies (verified) Amlodipine, Hctz [hydrochlorothiazide], and Lisinopril   History: Past Medical History:  Diagnosis Date   Asthma    Essential hypertension    GAD (generalized anxiety disorder)    Impacted cerumen of right ear 03/13/2018   Past Surgical History:  Procedure Laterality Date   CHOLECYSTECTOMY  2000   TONSILLECTOMY     Family History  Problem Relation Age of Onset   Hypertension Mother    Diabetes Mother    Hypertension Father    Stroke Father    COPD Sister    Cancer Sister    Hypertension Sister    Hypertension Sister    Social History   Socioeconomic History   Marital status: Widowed    Spouse name: Not on file   Number of children: Not on file   Years of education: Not on file   Highest education level: Not on file  Occupational History   Not on file  Tobacco Use   Smoking status: Former    Packs/day: 0.50    Types: Cigarettes   Smokeless tobacco: Never  Substance and Sexual Activity   Alcohol use: No   Drug use: No   Sexual activity: Not Currently  Other Topics Concern   Not on file  Social History Narrative   Widowed.   Retired, once worked in oMarketing executive   Enjoys crocheting,  making cards.     Social Determinants of Health   Financial Resource Strain: Low Risk  (09/04/2022)   Overall Financial Resource Strain (CARDIA)    Difficulty of Paying Living Expenses: Not hard at all  Food Insecurity: No Food Insecurity (09/04/2022)   Hunger Vital Sign    Worried About Running Out of Food in the Last Year: Never true    Ran Out of Food in the Last Year: Never true  Transportation Needs: No Transportation Needs (09/04/2022)   PRAPARE - Hydrologist (Medical): No    Lack of Transportation (Non-Medical): No  Physical Activity: Inactive (09/04/2022)   Exercise Vital Sign    Days of Exercise per Week: 0 days     Minutes of Exercise per Session: 0 min  Stress: No Stress Concern Present (09/04/2022)   [place]    Feeling of Stress : Not at all  Social Connections: Moderately Isolated (09/04/2022)   Social Connection and Isolation Panel [NHANES]    Frequency of Communication with Friends and Family: More than three times a week    Frequency of Social Gatherings with Friends and Family: Twice a week    Attends Religious Services: More than 4 times per year    Active Member of [employer] or Organizations: No    Attends Archivist Meetings: Never    Marital Status: Widowed    Tobacco Counseling Counseling given: Not Answered   Clinical Intake:  Pre-visit preparation completed: Yes  Pain : No/denies pain     Nutritional Risks: None Diabetes: No  How often do you need to have someone help you when you read instructions, pamphlets, or other written materials from your doctor or pharmacy?: 1 - Never  Diabetic? no  Interpreter Needed?: No  Information entered by :: C.Cierra Rothgeb LPN   Activities of Daily Living    09/04/2022   11:30 AM  In your present state of health, do you have any difficulty performing the following activities:  Hearing? 0  Vision? 0  Difficulty concentrating or making decisions? 0  Walking or climbing stairs? 0  Dressing or bathing? 0  Doing errands, shopping? 0  Preparing Food and eating ? N  Using the Toilet? N  In the past six months, have you accidently leaked urine? Y  Comment ocasionally when waits to long  Do you have problems with loss of bowel control? N  Managing your Medications? N  Managing your Finances? N  Housekeeping or managing your Housekeeping? N    Patient Care Team: Pleas Koch, NP as PCP - General (Internal Medicine)  Indicate any recent Medical Services you may have received from other than [facility] providers in the past year (date may be approximate).      Assessment:   This is a routine wellness examination for [name].  Hearing/Vision screen Hearing Screening - Comments:: No aids Vision Screening - Comments:: [place]   Dietary issues and exercise activities discussed: Current Exercise Habits: The patient does not participate in regular exercise at present, Exercise limited by: None identified   Goals Addressed             This Visit's Progress    Patient Stated       Lose 15-20lbs.       Depression Screen    09/04/2022   11:26 AM 09/01/2021   12:00 PM 08/31/2020   11:57 AM 08/26/2019    2:03 PM  09/04/2018    9:35 AM  PHQ 2/9 Scores  PHQ - 2 Score 0 0 0 0 0  PHQ- 9 Score   0 0     Fall Risk    09/04/2022   11:30 AM 09/01/2021   11:59 AM 08/31/2020   11:56 AM 08/26/2019    2:02 PM 06/01/2019    9:38 AM  Fall Risk   Falls in the past year? 0 0 0 0 0  Comment     Emmi Telephone Survey: data to providers prior to load  Number falls in past yr: 0 0 0 0   Injury with Fall? 0 0 0 0   Risk for fall due to : No Fall Risks No Fall Risks Medication side effect Medication side effect   Follow up Falls prevention discussed;Falls evaluation completed Falls prevention discussed Falls evaluation completed;Falls prevention discussed Falls evaluation completed;Falls prevention discussed     FALL RISK PREVENTION PERTAINING TO THE HOME:  Any stairs in or around the home? No  If so, are there any without handrails? No  Home free of loose throw rugs in walkways, pet beds, electrical cords, etc? Yes  Adequate lighting in your home to reduce risk of falls? Yes   ASSISTIVE DEVICES UTILIZED TO PREVENT FALLS:  Life alert? No  Use of a cane, walker or w/c? No  Grab bars in the bathroom? Yes  Shower chair or bench in shower? Yes  Elevated toilet seat or a handicapped toilet? No    Cognitive Function:    08/31/2020   11:58 AM 08/26/2019    2:04 PM  MMSE - Mini Mental State Exam  Orientation to time 5 5  Orientation  to Place 5 5  Registration 3 3  Attention/ Calculation 5 5  Recall 3 3  Language- repeat 1 1        09/04/2022   11:31 AM  6CIT Screen  What time? 0 points  Count back from 20 0 points  Months in reverse 0 points  Repeat phrase 2 points    Immunizations Immunization History  Administered Date(s) Administered   PFIZER(Purple Top)SARS-COV-2 Vaccination 05/06/2020, 05/22/2020    TDAP status: Due, Education has been provided regarding the importance of this vaccine. Advised may receive this vaccine at local pharmacy or Health Dept. Aware to provide a copy of the vaccination record if obtained from local pharmacy or Health Dept. Verbalized acceptance and understanding.  Flu Vaccine status: Declined, Education has been provided regarding the importance of this vaccine but patient still declined. Advised may receive this vaccine at local pharmacy or Health Dept. Aware to provide a copy of the vaccination record if obtained from local pharmacy or Health Dept. Verbalized acceptance and understanding.  Pneumococcal vaccine status: Declined,  Education has been provided regarding the importance of this vaccine but patient still declined. Advised may receive this vaccine at local pharmacy or Health Dept. Aware to provide a copy of the vaccination record if obtained from local pharmacy or Health Dept. Verbalized acceptance and understanding.   Covid-19 vaccine status: Declined, Education has been provided regarding the importance of this vaccine but patient still declined. Advised may receive this vaccine at local pharmacy or Health Dept.or vaccine clinic. Aware to provide a copy of the vaccination record if obtained from local pharmacy or Health Dept. Verbalized acceptance and understanding.  Qualifies for Shingles Vaccine? Yes   Zostavax completed No   Shingrix Completed?: No.    Education has been provided regarding the importance of  this vaccine. Patient has been advised to call insurance  company to determine out of pocket expense if they have not yet received this vaccine. Advised may also receive vaccine at local pharmacy or Health Dept. Verbalized acceptance and understanding.  Screening Tests Health Maintenance  Topic Date Due   Pneumonia Vaccine 57+ Years old (1 of 2 - PCV) Never done   DTaP/Tdap/Td (1 - Tdap) Never done   Zoster Vaccines- Shingrix (1 of 2) Never done   INFLUENZA VACCINE  08/30/2023 (Originally 02/06/2022)   MAMMOGRAM  08/30/2023 (Originally 04/13/2001)   DEXA SCAN  08/30/2023 (Originally 04/13/2016)   COLONOSCOPY (Pts 45-11yr Insurance coverage will need to be confirmed)  08/30/2023 (Originally 04/13/1996)   Medicare Annual Wellness (AWV)  09/05/2023   Hepatitis C Screening  Completed   HPV VACCINES  Aged Out   COVID-19 Vaccine  Discontinued    Health Maintenance  Health Maintenance Due  Topic Date Due   Pneumonia Vaccine 72 Years old (1 of 2 - PCV) Never done   DTaP/Tdap/Td (1 - Tdap) Never done   Zoster Vaccines- Shingrix (1 of 2) Never done    Colorectal cancer screening: No longer required.  Pt declined.  Mammogram status: No longer required due to pt declined.  Done Scan-  Pt Declined  Lung Cancer Screening: (Low Dose CT Chest recommended if Age 72-80years, 30 pack-year currently smoking OR have quit w/in 15years.) does not qualify.   Lung Cancer Screening Referral: no  Additional Screening:  Hepatitis C Screening: does not qualify; Completed 08/23/2021  Vision Screening: Recommended annual ophthalmology exams for early detection of glaucoma and other disorders of the eye. Is the patient up to date with their annual eye exam?  No  Who is the provider or what is the name of the office in which the patient attends annual eye exams? [facility] If pt is not established with a provider, would they like to be referred to a provider to establish care? No .  Pt will call for appointment.  Dental Screening: Recommended annual dental  exams for proper oral hygiene  Community Resource Referral / Chronic Care Management: CRR required this visit?  No   CCM required this visit?  No      Plan:     I have personally reviewed and noted the following in the patient's chart:   Medical and social history Use of alcohol, tobacco or illicit drugs  Current medications and supplements including opioid prescriptions. Patient is not currently taking opioid prescriptions. Functional ability and status Nutritional status Physical activity Advanced directives List of other physicians Hospitalizations, surgeries, and ER visits in previous 12 months Vitals Screenings to include cognitive, depression, and falls Referrals and appointments  In addition, I have reviewed and discussed with patient certain preventive protocols, quality metrics, and best practice recommendations. A written personalized care plan for preventive services as well as general preventive health recommendations were provided to patient.     CLebron Conners LPN   [id number]  Nurse Notes: Vaccinations: declines all Influenza vaccine: recommend every Fall Pneumococcal vaccine: recommend once per lifetime Prevnar-20 Tdap vaccine: recommend every 10 years Shingles vaccine: recommend Shingrix which is 2 doses 2-6 months apart and over 90% effective     Covid-19: recommend 2 doses one month apart with a booster 6 months later  Pt declined mammogram, dexa scan, and colonoscopy.

## 2022-09-05 ENCOUNTER — Ambulatory Visit (INDEPENDENT_AMBULATORY_CARE_PROVIDER_SITE_OTHER): Payer: PPO | Admitting: Primary Care

## 2022-09-05 ENCOUNTER — Encounter: Payer: Self-pay | Admitting: Primary Care

## 2022-09-05 VITALS — BP 150/88 | HR 104 | Temp 97.9°F | Ht 68.0 in | Wt 215.0 lb

## 2022-09-05 DIAGNOSIS — F411 Generalized anxiety disorder: Secondary | ICD-10-CM | POA: Diagnosis not present

## 2022-09-05 DIAGNOSIS — J4520 Mild intermittent asthma, uncomplicated: Secondary | ICD-10-CM

## 2022-09-05 DIAGNOSIS — I10 Essential (primary) hypertension: Secondary | ICD-10-CM | POA: Diagnosis not present

## 2022-09-05 DIAGNOSIS — Z0000 Encounter for general adult medical examination without abnormal findings: Secondary | ICD-10-CM | POA: Diagnosis not present

## 2022-09-05 DIAGNOSIS — J439 Emphysema, unspecified: Secondary | ICD-10-CM | POA: Diagnosis not present

## 2022-09-05 DIAGNOSIS — R7303 Prediabetes: Secondary | ICD-10-CM | POA: Diagnosis not present

## 2022-09-05 DIAGNOSIS — Z87891 Personal history of nicotine dependence: Secondary | ICD-10-CM

## 2022-09-05 LAB — COMPREHENSIVE METABOLIC PANEL
ALT: 16 U/L (ref 0–35)
AST: 16 U/L (ref 0–37)
Albumin: 4 g/dL (ref 3.5–5.2)
Alkaline Phosphatase: 79 U/L (ref 39–117)
BUN: 14 mg/dL (ref 6–23)
CO2: 26 mEq/L (ref 19–32)
Calcium: 10.1 mg/dL (ref 8.4–10.5)
Chloride: 97 mEq/L (ref 96–112)
Creatinine, Ser: 1.29 mg/dL — ABNORMAL HIGH (ref 0.40–1.20)
GFR: 41.79 mL/min — ABNORMAL LOW (ref >60.00)
Glucose, Bld: 115 mg/dL — ABNORMAL HIGH (ref 70–99)
Potassium: 4.8 mEq/L (ref 3.5–5.1)
Sodium: 130 mEq/L — ABNORMAL LOW (ref 135–145)
Total Bilirubin: 1 mg/dL (ref 0.2–1.2)
Total Protein: 7.5 g/dL (ref 6.0–8.3)

## 2022-09-05 LAB — LIPID PANEL
Cholesterol: 145 mg/dL (ref 0–200)
HDL: 45.2 mg/dL (ref >39.00)
LDL Cholesterol: 88 mg/dL (ref 0–99)
NonHDL: 100.23
Total CHOL/HDL Ratio: 3
Triglycerides: 61 mg/dL (ref 0.0–149.0)
VLDL: 12.2 mg/dL (ref 0.0–40.0)

## 2022-09-05 LAB — CBC
HCT: 42.4 % (ref 36.0–46.0)
Hemoglobin: 14.5 g/dL (ref 12.0–15.0)
MCHC: 34.1 g/dL (ref 30.0–36.0)
MCV: 87.4 fl (ref 78.0–100.0)
Platelets: 232 10*3/uL (ref 150.0–400.0)
RBC: 4.85 Mil/uL (ref 3.87–5.11)
RDW: 14.3 % (ref 11.5–15.5)
WBC: 7.1 10*3/uL (ref 4.0–10.5)

## 2022-09-05 LAB — HEMOGLOBIN A1C: Hgb A1c MFr Bld: 5.7 % (ref 4.6–6.5)

## 2022-09-05 MED ORDER — HYDROXYZINE HCL 10 MG PO TABS: ORAL_TABLET | ORAL | 0 refills | Status: AC

## 2022-09-05 MED ORDER — ALBUTEROL SULFATE HFA 108 (90 BASE) MCG/ACT IN AERS: 1.0000 | INHALATION_SPRAY | Freq: Four times a day (QID) | RESPIRATORY_TRACT | 0 refills | Status: AC | PRN

## 2022-09-05 NOTE — Assessment & Plan Note (Signed)
Controlled.  Irregular use of albuterol inhaler. Continue Breo 100-25 mcg daily.

## 2022-09-05 NOTE — Assessment & Plan Note (Signed)
Immunizations due, declines.  Mammogram and bone density scan due, she declines despite recommendations  Colonoscopy due, she declines despite recommendations   Discussed the importance of a healthy diet and regular exercise in order for weight loss, and to reduce the risk of further co-morbidity.  Exam stable. Labs pending.  Follow up in 1 year for repeat physical.

## 2022-09-05 NOTE — Assessment & Plan Note (Signed)
Declines lung cancer screening program again this year. FH of lung cancer in sister. She is aware of her risks for lung cancer.

## 2022-09-05 NOTE — Addendum Note (Signed)
Addended by: Pleas Koch on: 09/05/2022 11:32 AM   Modules accepted: Orders

## 2022-09-05 NOTE — Patient Instructions (Addendum)
Stop by the lab prior to leaving today. I will notify you of your results once received.   Please send me blood pressure readings via MyChart in a few weeks.  It was a pleasure to see you today!

## 2022-09-05 NOTE — Progress Notes (Addendum)
Subjective:    Patient ID: Jennifer Cannon, female    DOB: 04-13-1951, 72 y.o.   MRN: [id number]  HPI  Jennifer Cannon is a very pleasant 72 y.o. female who presents today for complete physical and follow up of chronic conditions.  Immunizations: -Influenza: Declines  -Shingles: Never completed, declines  -Pneumonia: Never completed, declines    Diet: Fair diet.  Exercise: No regular exercise.  Eye exam: Completes annually  Dental exam: Completed several years ago  Mammogram: Completed years ago, declines   Colonoscopy: Completed > 10 years ago, declines Lung Cancer Screening: Never completed. Has declined numerous times despite recommendations. Declines today Dexa: Never completed, declines   BP Readings from Last 3 Encounters:  09/05/22 (!) 150/88  08/23/21 (!) 164/90  01/06/21 140/80   She does not check her BP at home. She is very anxious about her visit today.       Review of Systems  Constitutional:  Negative for unexpected weight change.  HENT:  Negative for rhinorrhea.   Respiratory:  Negative for cough and shortness of breath.   Cardiovascular:  Negative for chest pain.  Gastrointestinal:  Negative for constipation and diarrhea.  Genitourinary:  Negative for difficulty urinating.  Musculoskeletal:  Negative for arthralgias and myalgias.  Skin:  Negative for rash.  Allergic/Immunologic: Negative for environmental allergies.  Neurological:  Negative for dizziness and headaches.  Psychiatric/Behavioral:  The patient is nervous/anxious.          Past Medical History:  Diagnosis Date   Asthma    Essential hypertension    GAD (generalized anxiety disorder)    Impacted cerumen of right ear 03/13/2018    Social History   Socioeconomic History   Marital status: Widowed    Spouse name: Not on file   Number of children: Not on file   Years of education: Not on file   Highest education level: Not on file  Occupational History   Not on file  Tobacco  Use   Smoking status: Former    Packs/day: 0.50    Types: Cigarettes   Smokeless tobacco: Never  Substance and Sexual Activity   Alcohol use: No   Drug use: No   Sexual activity: Not Currently  Other Topics Concern   Not on file  Social History Narrative   Widowed.   Retired, once worked in Marketing executive.   Enjoys crocheting, making cards.     Social Determinants of Health   Financial Resource Strain: Low Risk  (09/04/2022)   Overall Financial Resource Strain (CARDIA)    Difficulty of Paying Living Expenses: Not hard at all  Food Insecurity: No Food Insecurity (09/04/2022)   Hunger Vital Sign    Worried About Running Out of Food in the Last Year: Never true    Ran Out of Food in the Last Year: Never true  Transportation Needs: No Transportation Needs (09/04/2022)   PRAPARE - Hydrologist (Medical): No    Lack of Transportation (Non-Medical): No  Physical Activity: Inactive (09/04/2022)   Exercise Vital Sign    Days of Exercise per Week: 0 days    Minutes of Exercise per Session: 0 min  Stress: No Stress Concern Present (09/04/2022)   [place]    Feeling of Stress : Not at all  Social Connections: Moderately Isolated (09/04/2022)   Social Connection and Isolation Panel [NHANES]    Frequency of Communication with Friends and Family: More  than three times a week    Frequency of Social Gatherings with Friends and Family: Twice a week    Attends Religious Services: More than 4 times per year    Active Member of [employer] or Organizations: No    Attends Archivist Meetings: Never    Marital Status: Widowed  Intimate Partner Violence: Not At Risk (09/04/2022)   Humiliation, Afraid, Rape, and Kick questionnaire    Fear of Current or Ex-Partner: No    Emotionally Abused: No    Physically Abused: No    Sexually Abused: No    Past Surgical History:  Procedure Laterality Date    CHOLECYSTECTOMY  2000   TONSILLECTOMY      Family History  Problem Relation Age of Onset   Hypertension Mother    Diabetes Mother    Hypertension Father    Stroke Father    COPD Sister    Cancer Sister    Hypertension Sister    Hypertension Sister     Allergies  Allergen Reactions   Amlodipine     Ankle edema, shortness of breath, dizziness   Hctz [Hydrochlorothiazide] Other (See Comments)    Dry mouth, fatigue   Lisinopril     Current Outpatient Medications on File Prior to Visit  Medication Sig Dispense Refill   Cholecalciferol (VITAMIN D3) 50 MCG (2000 UT) TABS Take 1 tablet by mouth.     fluticasone furoate-vilanterol (BREO ELLIPTA) 100-25 MCG/ACT AEPB TAKE 1 PUFF BY MOUTH EVERY DAY 60 each 1   hydrALAZINE (APRESOLINE) 50 MG tablet TAKE 1 TABLET BY MOUTH EVERY EVENING FOR BLOOD PRESSURE 90 tablet 0   losartan (COZAAR) 100 MG tablet TAKE 1 TABLET BY MOUTH EVERY DAY FOR BLOOD PRESSURE 90 tablet 0   magnesium gluconate (MAGONATE) 500 MG tablet Take 500 mg by mouth daily.     OVER THE COUNTER MEDICATION Take 99 mg by mouth daily. Potassium     No current facility-administered medications on file prior to visit.    BP (!) 150/88   Pulse (!) 104   Temp 97.9 F (36.6 C) (Temporal)   Ht '5\' 8"'$  (1.727 m)   Wt 215 lb (97.5 kg)   SpO2 98%   BMI 32.69 kg/m  Objective:   Physical Exam HENT:     Right Ear: Tympanic membrane and ear canal normal.     Left Ear: Tympanic membrane and ear canal normal.     Nose: Nose normal.  Eyes:     Conjunctiva/sclera: Conjunctivae normal.     Pupils: Pupils are equal, round, and reactive to light.  Neck:     Thyroid: No thyromegaly.  Cardiovascular:     Rate and Rhythm: Normal rate and regular rhythm.     Heart sounds: No murmur heard. Pulmonary:     Effort: Pulmonary effort is normal.     Breath sounds: Normal breath sounds. No rales.  Abdominal:     General: Bowel sounds are normal.     Palpations: Abdomen is soft.      Tenderness: There is no abdominal tenderness.  Musculoskeletal:        General: Normal range of motion.     Cervical back: Neck supple.  Lymphadenopathy:     Cervical: No cervical adenopathy.  Skin:    General: Skin is warm and dry.     Findings: No rash.  Neurological:     Mental Status: She is alert and oriented to person, place, and time.     Cranial  Nerves: No cranial nerve deficit.     Deep Tendon Reflexes: Reflexes are normal and symmetric.  Psychiatric:        Mood and Affect: Mood normal.           Assessment & Plan:  Preventative health care Assessment & Plan: Immunizations due, declines.  Mammogram and bone density scan due, she declines despite recommendations  Colonoscopy due, she declines despite recommendations   Discussed the importance of a healthy diet and regular exercise in order for weight loss, and to reduce the risk of further co-morbidity.  Exam stable. Labs pending.  Follow up in 1 year for repeat physical.    Mild intermittent asthma without complication Assessment & Plan: Controlled.  Irregular use of albuterol inhaler. Continue Breo 100-25 mcg daily.  Orders: -     Albuterol Sulfate HFA; Inhale 1-2 puffs into the lungs every 6 (six) hours as needed for wheezing or shortness of breath.  Dispense: 8.5 each; Refill: 0  Pulmonary emphysema, unspecified emphysema type ([place]) Assessment & Plan: Controlled.  Irregular use of albuterol inhaler. Continue Breo 100-25 mcg daily.   GAD (generalized anxiety disorder) Assessment & Plan: Intermittent, seems uncontrolled.  She declines daily anxiety treatment.   Continue hydroxyzine 10 mg daily PRN.   Orders: -     hydrOXYzine HCl; Take 1 tablet by mouth once daily as needed for anxiety.  Dispense: 30 tablet; Refill: 0  Essential hypertension Assessment & Plan: Uncontrolled.   Historically above goal in the office.  Discussed to start checking BP at home.  Continue losartan 100 mg  daily and hydralazine 50 mg daily. She will send readings via MyChart in a few weeks.   CMP pending.  Orders: -     Lipid panel -     CBC -     Comprehensive metabolic panel  History of tobacco abuse Assessment & Plan: Declines lung cancer screening program again this year. FH of lung cancer in sister. She is aware of her risks for lung cancer.   Prediabetes Assessment & Plan: Repeat A1C pending.  Discussed the importance of a healthy diet and regular exercise in order for weight loss, and to reduce the risk of further co-morbidity.   Orders: -     Hemoglobin A1c        Pleas Koch, NP

## 2022-09-05 NOTE — Assessment & Plan Note (Addendum)
Intermittent, seems uncontrolled.  She declines daily anxiety treatment.   Continue hydroxyzine 10 mg daily PRN.

## 2022-09-05 NOTE — Assessment & Plan Note (Signed)
Repeat A1C pending.  Discussed the importance of a healthy diet and regular exercise in order for weight loss, and to reduce the risk of further co-morbidity.

## 2022-09-05 NOTE — Assessment & Plan Note (Addendum)
Uncontrolled.   Historically above goal in the office.  Discussed to start checking BP at home.  Continue losartan 100 mg daily and hydralazine 50 mg daily. She will send readings via MyChart in a few weeks.   CMP pending.

## 2022-09-20 ENCOUNTER — Telehealth: Payer: Self-pay | Admitting: Primary Care

## 2022-09-20 DIAGNOSIS — J439 Emphysema, unspecified: Secondary | ICD-10-CM

## 2022-09-20 DIAGNOSIS — J4520 Mild intermittent asthma, uncomplicated: Secondary | ICD-10-CM

## 2022-09-20 MED ORDER — FLUTICASONE FUROATE-VILANTEROL 100-25 MCG/ACT IN AEPB: INHALATION_SPRAY | RESPIRATORY_TRACT | 11 refills | Status: DC

## 2022-09-20 NOTE — Addendum Note (Signed)
Addended by: Pleas Koch on: 09/20/2022 07:58 PM   Modules accepted: Orders

## 2022-09-20 NOTE — Telephone Encounter (Signed)
Prescription Request  09/20/2022  LOV: 09/05/2022  What is the name of the medication or equipment? fluticasone furoate-vilanterol (BREO ELLIPTA) 100-25 MCG/ACT AEPB [id number]   Have you contacted your pharmacy to request a refill? Yes  Pt stated she reached out to pharmacy for refill but haven't heard anything back.   Which pharmacy would you like this sent to?  [id number] [id number]: [phone]: [phone]   Patient notified that their request is being sent to the clinical staff for review and that they should receive a response within 2 business days.   Please advise at Mobile [phone](mobile)

## 2022-09-20 NOTE — Telephone Encounter (Signed)
Refills sent to pharmacy. 

## 2022-09-20 NOTE — Telephone Encounter (Signed)
Called pharmacy patient does not have any refills left. She last picked up on 08/16/22.

## 2022-09-27 ENCOUNTER — Other Ambulatory Visit: Payer: Self-pay | Admitting: Primary Care

## 2022-09-27 DIAGNOSIS — F411 Generalized anxiety disorder: Secondary | ICD-10-CM

## 2022-09-28 ENCOUNTER — Other Ambulatory Visit: Payer: Self-pay | Admitting: Primary Care

## 2022-09-28 DIAGNOSIS — Z1231 Encounter for screening mammogram for malignant neoplasm of breast: Secondary | ICD-10-CM

## 2022-11-11 ENCOUNTER — Other Ambulatory Visit: Payer: Self-pay | Admitting: Primary Care

## 2022-11-11 DIAGNOSIS — I10 Essential (primary) hypertension: Secondary | ICD-10-CM

## 2022-11-20 ENCOUNTER — Telehealth: Payer: Self-pay | Admitting: Primary Care

## 2022-11-20 NOTE — Telephone Encounter (Signed)
What's her blood pressure running?

## 2022-11-20 NOTE — Telephone Encounter (Signed)
Called and spoke with patient she states she hasn't been checking her BP and recording it. She states she will check it the rest of the week and call back Friday with her readings.

## 2022-11-20 NOTE — Telephone Encounter (Signed)
Unable to reach patient. Left voicemail to return call to our office.   

## 2022-11-20 NOTE — Telephone Encounter (Signed)
Noted  

## 2022-11-20 NOTE — Telephone Encounter (Signed)
Patient called in and stated that she hasn't been able to take the extra half of hydrALAZINE (APRESOLINE) 50 MG tablet. She stated that it makes her feel sick. She stated that she has been feeling fine though. Thank you!

## 2022-11-21 ENCOUNTER — Other Ambulatory Visit: Payer: Self-pay | Admitting: Primary Care

## 2022-11-21 DIAGNOSIS — I10 Essential (primary) hypertension: Secondary | ICD-10-CM

## 2023-08-02 ENCOUNTER — Other Ambulatory Visit: Payer: Self-pay | Admitting: Primary Care

## 2023-08-02 DIAGNOSIS — I10 Essential (primary) hypertension: Secondary | ICD-10-CM

## 2023-08-08 ENCOUNTER — Other Ambulatory Visit: Payer: Self-pay | Admitting: Primary Care

## 2023-08-08 DIAGNOSIS — I10 Essential (primary) hypertension: Secondary | ICD-10-CM

## 2023-09-04 ENCOUNTER — Ambulatory Visit (INDEPENDENT_AMBULATORY_CARE_PROVIDER_SITE_OTHER): Payer: PPO | Admitting: Primary Care

## 2023-09-04 ENCOUNTER — Ambulatory Visit: Payer: PPO | Attending: Primary Care

## 2023-09-04 ENCOUNTER — Encounter: Payer: Self-pay | Admitting: Primary Care

## 2023-09-04 VITALS — BP 160/98 | HR 90 | Temp 97.1°F | Ht 68.0 in | Wt 207.0 lb

## 2023-09-04 DIAGNOSIS — I10 Essential (primary) hypertension: Secondary | ICD-10-CM

## 2023-09-04 DIAGNOSIS — Z0001 Encounter for general adult medical examination with abnormal findings: Secondary | ICD-10-CM

## 2023-09-04 DIAGNOSIS — R0609 Other forms of dyspnea: Secondary | ICD-10-CM

## 2023-09-04 DIAGNOSIS — J439 Emphysema, unspecified: Secondary | ICD-10-CM | POA: Diagnosis not present

## 2023-09-04 DIAGNOSIS — F411 Generalized anxiety disorder: Secondary | ICD-10-CM

## 2023-09-04 DIAGNOSIS — R7303 Prediabetes: Secondary | ICD-10-CM

## 2023-09-04 DIAGNOSIS — Z87891 Personal history of nicotine dependence: Secondary | ICD-10-CM | POA: Diagnosis not present

## 2023-09-04 LAB — COMPREHENSIVE METABOLIC PANEL
ALT: 17 U/L (ref 0–35)
AST: 17 U/L (ref 0–37)
Albumin: 4.1 g/dL (ref 3.5–5.2)
Alkaline Phosphatase: 74 U/L (ref 39–117)
BUN: 17 mg/dL (ref 6–23)
CO2: 25 meq/L (ref 19–32)
Calcium: 9.5 mg/dL (ref 8.4–10.5)
Chloride: 99 meq/L (ref 96–112)
Creatinine, Ser: 1.4 mg/dL — ABNORMAL HIGH (ref 0.40–1.20)
GFR: 37.62 mL/min — ABNORMAL LOW (ref >60.00)
Glucose, Bld: 121 mg/dL — ABNORMAL HIGH (ref 70–99)
Potassium: 4.9 meq/L (ref 3.5–5.1)
Sodium: 134 meq/L — ABNORMAL LOW (ref 135–145)
Total Bilirubin: 0.9 mg/dL (ref 0.2–1.2)
Total Protein: 7.2 g/dL (ref 6.0–8.3)

## 2023-09-04 LAB — LIPID PANEL
Cholesterol: 147 mg/dL (ref 0–200)
HDL: 43.3 mg/dL (ref >39.00)
LDL Cholesterol: 93 mg/dL (ref 0–99)
NonHDL: 103.91
Total CHOL/HDL Ratio: 3
Triglycerides: 56 mg/dL (ref 0.0–149.0)
VLDL: 11.2 mg/dL (ref 0.0–40.0)

## 2023-09-04 LAB — CBC
HCT: 42.2 % (ref 36.0–46.0)
Hemoglobin: 14.3 g/dL (ref 12.0–15.0)
MCHC: 33.9 g/dL (ref 30.0–36.0)
MCV: 88 fL (ref 78.0–100.0)
Platelets: 220 10*3/uL (ref 150.0–400.0)
RBC: 4.8 Mil/uL (ref 3.87–5.11)
RDW: 15.3 % (ref 11.5–15.5)
WBC: 7 10*3/uL (ref 4.0–10.5)

## 2023-09-04 LAB — BRAIN NATRIURETIC PEPTIDE: Pro B Natriuretic peptide (BNP): 52 pg/mL (ref 0.0–100.0)

## 2023-09-04 LAB — HEMOGLOBIN A1C: Hgb A1c MFr Bld: 5.7 % (ref 4.6–6.5)

## 2023-09-04 LAB — TSH: TSH: 0.21 u[IU]/mL — ABNORMAL LOW (ref 0.35–5.50)

## 2023-09-04 NOTE — Assessment & Plan Note (Signed)
 Repeat A1c pending

## 2023-09-04 NOTE — Assessment & Plan Note (Addendum)
 Uncontrolled today and historically when in the office, home readings are at goal.  Continue hydralazine 50 mg daily, losartan 100 mg daily. CMP pending.

## 2023-09-04 NOTE — Assessment & Plan Note (Signed)
 Declines lung cancer screening program.

## 2023-09-04 NOTE — Assessment & Plan Note (Signed)
 Unclear if controlled. Exertional dyspnea could be from COPD versus undiagnosed CAD.  Continue Breo Ellipta 100-25 mcg, 1 puff daily, albuterol inhaler as needed. She declines lung cancer screening program.  Await cardiac workup results.

## 2023-09-04 NOTE — Assessment & Plan Note (Signed)
 No concerns today. Continue to monitor.

## 2023-09-04 NOTE — Patient Instructions (Signed)
 You will receive a phone call regarding the echocardiogram and the CT scan of the arteries of the heart.  Complete the Holter monitor once it arrives to your home.  Wear it for 14 days and ship it back off.  Stop by the lab prior to leaving today. I will notify you of your results once received.   It was a pleasure to see you today!

## 2023-09-04 NOTE — Assessment & Plan Note (Signed)
 Uncontrolled.  Recommended cardiology referral for which she declines.  She does agree to obtain a 2D echocardiogram, CT coronary calcium score, and to wear a Holter monitor.  All orders placed today. Lipid panel ordered and pending.

## 2023-09-04 NOTE — Progress Notes (Addendum)
 Subjective:    Patient ID: Jennifer Cannon, female    DOB: [birth year], 73 y.o.   MRN: [id number]  HPI  Jennifer Cannon is a very pleasant 73 y.o. female who presents today for complete physical and follow up of chronic conditions.  She continues to experience exertional shortness of breath which occurs with walking to the mailbox, grocery store shopping. She is managed on Breo Inhaler 100-25 1 puff daily and albuterol inhaler PRN. She uses her albuterol inhaler 2-3 times annually with Spring seasons. She denies chest pain, diaphoresis, dizziness, headaches, lower extremity edema, chronic cough. She has not undergone cardiac testing.   Immunizations: -Influenza: Declines influenza vaccine.  -Shingles: Never completed -Pneumonia: Never completed, declines   Diet: Fair diet.  Exercise: No regular exercise.  Eye exam: Completes annually  Dental exam: Completes semi-annually    Mammogram: Completed years ago, continues to decline.  Bone Density Scan: Never completed, declines   Colonoscopy: Completed > 10 years ago, declines. Declines Cologuard.  Lung Cancer Screening: Never completed, declined numerous years. Declines today. Quit in 2018.   BP Readings from Last 3 Encounters:  09/04/23 (!) 160/98  09/05/22 (!) 150/88  08/23/21 (!) 164/90   She is checking her BP at home which is running 130s/80s mostly.     Review of Systems  Constitutional:  Negative for unexpected weight change.  HENT:  Negative for rhinorrhea.   Respiratory:  Positive for shortness of breath. Negative for cough.   Cardiovascular:  Negative for chest pain.  Gastrointestinal:  Negative for constipation and diarrhea.  Genitourinary:  Negative for difficulty urinating.  Musculoskeletal:  Negative for arthralgias and myalgias.  Skin:  Negative for rash.  Allergic/Immunologic: Negative for environmental allergies.  Neurological:  Negative for dizziness and headaches.  Psychiatric/Behavioral:  The patient is  not nervous/anxious.          Past Medical History:  Diagnosis Date   Asthma    Essential hypertension    GAD (generalized anxiety disorder)    Impacted cerumen of right ear 03/13/2018    Social History   Socioeconomic History   Marital status: Widowed    Spouse name: Not on file   Number of children: Not on file   Years of education: Not on file   Highest education level: Some college, no degree  Occupational History   Not on file  Tobacco Use   Smoking status: Former    Current packs/day: 0.50    Types: Cigarettes   Smokeless tobacco: Never  Substance and Sexual Activity   Alcohol use: No   Drug use: No   Sexual activity: Not Currently  Other Topics Concern   Not on file  Social History Narrative   Widowed.   Retired, once worked in Physiological scientist.   Enjoys crocheting, making cards.     Social Drivers of Corporate investment banker Strain: Low Risk  (09/02/2023)   Overall Financial Resource Strain (CARDIA)    Difficulty of Paying Living Expenses: Not hard at all  Food Insecurity: No Food Insecurity (09/02/2023)   Hunger Vital Sign    Worried About Running Out of Food in the Last Year: Never true    Ran Out of Food in the Last Year: Never true  Transportation Needs: No Transportation Needs (09/02/2023)   PRAPARE - Administrator, Civil Service (Medical): No    Lack of Transportation (Non-Medical): No  Physical Activity: Insufficiently Active (09/02/2023)   Exercise Vital Sign  Days of Exercise per Week: 1 day    Minutes of Exercise per Session: 10 min  Stress: No Stress Concern Present (09/02/2023)   [employer] of Occupational Health - Occupational Stress Questionnaire    Feeling of Stress : Only a little  Social Connections: Moderately Isolated (09/02/2023)   Social Connection and Isolation Panel [NHANES]    Frequency of Communication with Friends and Family: More than three times a week    Frequency of Social Gatherings with Friends  and Family: Twice a week    Attends Religious Services: More than 4 times per year    Active Member of [employer] or Organizations: No    Attends Banker Meetings: Never    Marital Status: Widowed  Intimate Partner Violence: Not At Risk (09/04/2022)   Humiliation, Afraid, Rape, and Kick questionnaire    Fear of Current or Ex-Partner: No    Emotionally Abused: No    Physically Abused: No    Sexually Abused: No    Past Surgical History:  Procedure Laterality Date   CHOLECYSTECTOMY  2000   TONSILLECTOMY      Family History  Problem Relation Age of Onset   Hypertension Mother    Diabetes Mother    Hypertension Father    Stroke Father    COPD Sister    Cancer Sister    Hypertension Sister    Hypertension Sister     Allergies  Allergen Reactions   Amlodipine     Ankle edema, shortness of breath, dizziness   Hctz [Hydrochlorothiazide] Other (See Comments)    Dry mouth, fatigue   Lisinopril     Current Outpatient Medications on File Prior to Visit  Medication Sig Dispense Refill   Cholecalciferol (VITAMIN D3) 50 MCG (2000 UT) TABS Take 1 tablet by mouth.     fluticasone furoate-vilanterol (BREO ELLIPTA) 100-25 MCG/ACT AEPB TAKE 1 PUFF BY MOUTH EVERY DAY 60 each 11   hydrALAZINE (APRESOLINE) 50 MG tablet TAKE 1 TABLET BY MOUTH EVERY EVENING FOR BLOOD PRESSURE 90 tablet 0   hydrOXYzine (ATARAX) 10 MG tablet Take 1 tablet by mouth once daily as needed for anxiety. 30 tablet 0   losartan (COZAAR) 100 MG tablet TAKE 1 TABLET BY MOUTH EVERY DAY FOR BLOOD PRESSURE 90 tablet 0   magnesium gluconate (MAGONATE) 500 MG tablet Take 500 mg by mouth daily.     OVER THE COUNTER MEDICATION Take 99 mg by mouth daily. Potassium     albuterol (VENTOLIN HFA) 108 (90 Base) MCG/ACT inhaler Inhale 1-2 puffs into the lungs every 6 (six) hours as needed for wheezing or shortness of breath. (Patient not taking: Reported on 09/04/2023) 8.5 each 0   No current facility-administered medications  on file prior to visit.    BP (!) 160/98   Pulse 90   Temp (!) 97.1 F (36.2 C) (Temporal)   Ht 5\' 8"  (1.727 m)   Wt 207 lb (93.9 kg)   SpO2 98%   BMI 31.47 kg/m  Objective:   Physical Exam HENT:     Right Ear: Tympanic membrane and ear canal normal.     Left Ear: Tympanic membrane and ear canal normal.  Eyes:     Pupils: Pupils are equal, round, and reactive to light.  Cardiovascular:     Rate and Rhythm: Normal rate and regular rhythm.  Pulmonary:     Effort: Pulmonary effort is normal.     Breath sounds: Normal breath sounds.  Abdominal:     General:  Bowel sounds are normal.     Palpations: Abdomen is soft.     Tenderness: There is no abdominal tenderness.  Musculoskeletal:        General: Normal range of motion.     Cervical back: Neck supple.  Skin:    General: Skin is warm and dry.  Neurological:     Mental Status: She is alert and oriented to person, place, and time.     Cranial Nerves: No cranial nerve deficit.     Deep Tendon Reflexes:     Reflex Scores:      Patellar reflexes are 2+ on the right side and 2+ on the left side. Psychiatric:        Mood and Affect: Mood normal.           Assessment & Plan:  Encounter for annual general medical examination with abnormal findings in adult Assessment & Plan: Declines all vaccines despite recommendations Mammogram overdue, declines despite recommendations Colonoscopy overdue, declines colonoscopy and Cologuard despite recommendations Declines lung cancer screening program.  Discussed the importance of a healthy diet and regular exercise in order for weight loss, and to reduce the risk of further co-morbidity.  Exam stable. Labs pending.  Follow up in 1 year for repeat physical.    Exertional dyspnea Assessment & Plan: Uncontrolled.  Recommended cardiology referral for which she declines.  She does agree to obtain a 2D echocardiogram, CT coronary calcium score, and to wear a Holter monitor.  All  orders placed today. Lipid panel ordered and pending.  Orders: -     ECHOCARDIOGRAM COMPLETE; Future -     CT CARDIAC SCORING (SELF PAY ONLY); Future -     LONG TERM MONITOR (3-14 DAYS); Future -     Brain natriuretic peptide -     TSH  Essential hypertension Assessment & Plan: Uncontrolled today and historically when in the office, home readings are at goal.  Continue hydralazine 50 mg daily, losartan 100 mg daily. CMP pending.  Orders: -     ECHOCARDIOGRAM COMPLETE; Future -     CT CARDIAC SCORING (SELF PAY ONLY); Future -     LONG TERM MONITOR (3-14 DAYS); Future -     Comprehensive metabolic panel -     Lipid panel -     CBC  Prediabetes Assessment & Plan: Repeat A1c pending.  Orders: -     Hemoglobin A1c  Pulmonary emphysema, unspecified emphysema type ([facility]) Assessment & Plan: Unclear if controlled. Exertional dyspnea could be from COPD versus undiagnosed CAD.  Continue Breo Ellipta 100-25 mcg, 1 puff daily, albuterol inhaler as needed. She declines lung cancer screening program.  Await cardiac workup results.   GAD (generalized anxiety disorder) Assessment & Plan: No concerns today.  Continue to monitor.   History of tobacco abuse Assessment & Plan: Declines lung cancer screening program.         Doreene Nest, NP

## 2023-09-04 NOTE — Assessment & Plan Note (Signed)
 Declines all vaccines despite recommendations Mammogram overdue, declines despite recommendations Colonoscopy overdue, declines colonoscopy and Cologuard despite recommendations Declines lung cancer screening program.  Discussed the importance of a healthy diet and regular exercise in order for weight loss, and to reduce the risk of further co-morbidity.  Exam stable. Labs pending.  Follow up in 1 year for repeat physical.

## 2023-09-05 ENCOUNTER — Ambulatory Visit (INDEPENDENT_AMBULATORY_CARE_PROVIDER_SITE_OTHER): Payer: PPO

## 2023-09-05 ENCOUNTER — Other Ambulatory Visit: Payer: Self-pay | Admitting: Primary Care

## 2023-09-05 DIAGNOSIS — R7989 Other specified abnormal findings of blood chemistry: Secondary | ICD-10-CM

## 2023-09-05 LAB — T4, FREE: Free T4: 1.15 ng/dL (ref 0.60–1.60)

## 2023-09-05 NOTE — Telephone Encounter (Signed)
 Please call patient and find out her questions.

## 2023-09-06 ENCOUNTER — Other Ambulatory Visit: Payer: Self-pay | Admitting: Primary Care

## 2023-09-06 DIAGNOSIS — N1832 Chronic kidney disease, stage 3b: Secondary | ICD-10-CM

## 2023-09-06 NOTE — Telephone Encounter (Signed)
 Noted.

## 2023-09-06 NOTE — Telephone Encounter (Signed)
 Called patient she states the Cardiac CT and Echocardiogram is going to be too expensive for her to get done right now. She would like to do the heart monitor and see what that says and if needed then she is agreeable to further testing/ seeing specialist. She got notification that monitor has been mailed.

## 2023-09-09 ENCOUNTER — Ambulatory Visit (INDEPENDENT_AMBULATORY_CARE_PROVIDER_SITE_OTHER): Payer: PPO

## 2023-09-09 ENCOUNTER — Other Ambulatory Visit: Payer: Self-pay | Admitting: Primary Care

## 2023-09-09 VITALS — Ht 68.0 in | Wt 207.0 lb

## 2023-09-09 DIAGNOSIS — Z0000 Encounter for general adult medical examination without abnormal findings: Secondary | ICD-10-CM

## 2023-09-09 DIAGNOSIS — N1832 Chronic kidney disease, stage 3b: Secondary | ICD-10-CM

## 2023-09-09 DIAGNOSIS — I10 Essential (primary) hypertension: Secondary | ICD-10-CM

## 2023-09-09 NOTE — Patient Instructions (Signed)
 Jennifer Cannon , Thank you for taking time to come for your Medicare Wellness Visit. I appreciate your ongoing commitment to your health goals. Please review the following plan we discussed and let me know if I can assist you in the future.   Referrals/Orders/Follow-Ups/Clinician Recommendations: none  This is a list of the screening recommended for you and due dates:  Health Maintenance  Topic Date Due   Pneumonia Vaccine (1 of 2 - PCV) Never done   Flu Shot  10/07/2023*   Zoster (Shingles) Vaccine (1 of 2) 12/02/2023*   Mammogram  09/03/2024*   DEXA scan (bone density measurement)  09/03/2024*   Colon Cancer Screening  09/03/2024*   Medicare Annual Wellness Visit  09/08/2024   Hepatitis C Screening  Completed   HPV Vaccine  Aged Out   DTaP/Tdap/Td vaccine  Discontinued   COVID-19 Vaccine  Discontinued  *Topic was postponed. The date shown is not the original due date.    Advanced directives: (Copy Requested) Please bring a copy of your health care power of attorney and living will to the office to be added to your chart at your convenience.  Next Medicare Annual Wellness Visit scheduled for next year: Yes 09/09/2024 @3 :40pm televisit

## 2023-09-09 NOTE — Progress Notes (Signed)
 Subjective:   Jennifer Cannon is a 73 y.o. who presents for a Medicare Wellness preventive visit.  Visit Complete: Virtual I connected with  [name] on 09/09/23 by a audio enabled telemedicine application and verified that I am speaking with the correct person using two identifiers.  Patient Location: Home  Provider Location: Office/Clinic  I discussed the limitations of evaluation and management by telemedicine. The patient expressed understanding and agreed to proceed.  Vital Signs: Because this visit was a virtual/telehealth visit, some criteria may be missing or patient reported. Any vitals not documented were not able to be obtained and vitals that have been documented are patient reported.  VideoDeclined- This patient declined Librarian, academic. Therefore the visit was completed with audio only.  AWV Questionnaire: No: Patient Medicare AWV questionnaire was not completed prior to this visit.  Cardiac Risk Factors include: advanced age (>51men, >12 women);hypertension;obesity (BMI >30kg/m2);sedentary lifestyle     Objective:    Today's Vitals   09/09/23 1502  Weight: 207 lb (93.9 kg)  Height: 5\' 8"  (1.727 m)   Body mass index is 31.47 kg/m.     09/09/2023    3:11 PM 09/04/2022   11:28 AM 09/01/2021   11:58 AM 08/31/2020   11:54 AM 08/26/2019    2:01 PM  Advanced Directives  Does Patient Have a Medical Advance Directive? Yes No Yes No Yes  Type of Estate agent of [place];Living will  Healthcare Power of [place];Living will  Healthcare Power of [place];Living will  Does patient want to make changes to medical advance directive?   Yes (MAU/Ambulatory/Procedural Areas - Information given)    Copy of Healthcare Power of Attorney in Chart? No - copy requested    No - copy requested  Would patient like information on creating a medical advance directive?  No - Patient declined  No - Patient declined     Current  Medications (verified) Outpatient Encounter Medications as of 09/09/2023  Medication Sig   albuterol (VENTOLIN HFA) 108 (90 Base) MCG/ACT inhaler Inhale 1-2 puffs into the lungs every 6 (six) hours as needed for wheezing or shortness of breath.   Cholecalciferol (VITAMIN D3) 50 MCG (2000 UT) TABS Take 1 tablet by mouth.   fluticasone furoate-vilanterol (BREO ELLIPTA) 100-25 MCG/ACT AEPB TAKE 1 PUFF BY MOUTH EVERY DAY   hydrALAZINE (APRESOLINE) 50 MG tablet TAKE 1 TABLET BY MOUTH EVERY EVENING FOR BLOOD PRESSURE   hydrOXYzine (ATARAX) 10 MG tablet Take 1 tablet by mouth once daily as needed for anxiety.   losartan (COZAAR) 100 MG tablet TAKE 1 TABLET BY MOUTH EVERY DAY FOR BLOOD PRESSURE   magnesium gluconate (MAGONATE) 500 MG tablet Take 500 mg by mouth daily.   OVER THE COUNTER MEDICATION Take 99 mg by mouth daily. Potassium   No facility-administered encounter medications on file as of 09/09/2023.   Allergies (verified) Amlodipine, Hctz [hydrochlorothiazide], and Lisinopril   History: Past Medical History:  Diagnosis Date   Allergy Grass   Asthma    Essential hypertension    GAD (generalized anxiety disorder)    Impacted cerumen of right ear 03/13/2018   Past Surgical History:  Procedure Laterality Date   CHOLECYSTECTOMY  2000   TONSILLECTOMY     Family History  Problem Relation Age of Onset   Hypertension Mother    Diabetes Mother    Hypertension Father    Stroke Father    COPD Sister    Cancer Sister    Hypertension  Sister    Hypertension Sister    Social History   Socioeconomic History   Marital status: Widowed    Spouse name: Not on file   Number of children: Not on file   Years of education: Not on file   Highest education level: Some college, no degree  Occupational History   Not on file  Tobacco Use   Smoking status: Former    Current packs/day: 0.50    Types: Cigarettes   Smokeless tobacco: Never  Substance and Sexual Activity   Alcohol use: No   Drug  use: No   Sexual activity: Not Currently  Other Topics Concern   Not on file  Social History Narrative   Widowed.   Retired, once worked in Physiological scientist.   Enjoys crocheting, making cards.     Social Drivers of Corporate investment banker Strain: Low Risk  (09/09/2023)   Overall Financial Resource Strain (CARDIA)    Difficulty of Paying Living Expenses: Not hard at all  Food Insecurity: No Food Insecurity (09/09/2023)   Hunger Vital Sign    Worried About Running Out of Food in the Last Year: Never true    Ran Out of Food in the Last Year: Never true  Transportation Needs: No Transportation Needs (09/09/2023)   PRAPARE - Administrator, Civil Service (Medical): No    Lack of Transportation (Non-Medical): No  Physical Activity: Insufficiently Active (09/09/2023)   Exercise Vital Sign    Days of Exercise per Week: 5 days    Minutes of Exercise per Session: 10 min  Stress: No Stress Concern Present (09/09/2023)   [employer] of Occupational Health - Occupational Stress Questionnaire    Feeling of Stress : Only a little  Social Connections: Moderately Isolated (09/09/2023)   Social Connection and Isolation Panel [NHANES]    Frequency of Communication with Friends and Family: More than three times a week    Frequency of Social Gatherings with Friends and Family: More than three times a week    Attends Religious Services: More than 4 times per year    Active Member of [employer] or Organizations: No    Attends Banker Meetings: Never    Marital Status: Widowed    Tobacco Counseling Counseling given: Not Answered  Clinical Intake:  Pre-visit preparation completed: Yes  Pain : No/denies pain   BMI - recorded: 31.47 Nutritional Status: BMI > 30  Obese Nutritional Risks: None Diabetes: No  How often do you need to have someone help you when you read instructions, pamphlets, or other written materials from your doctor or pharmacy?: 1 -  Never  Interpreter Needed?: No  Comments: lives alone Information entered by :: B.Bohdan Macho,LPN  Activities of Daily Living     09/09/2023    3:12 PM  In your present state of health, do you have any difficulty performing the following activities:  Hearing? 0  Vision? 0  Difficulty concentrating or making decisions? 0  Walking or climbing stairs? 0  Dressing or bathing? 0  Doing errands, shopping? 0  Preparing Food and eating ? N  Using the Toilet? N  In the past six months, have you accidently leaked urine? N  Do you have problems with loss of bowel control? N  Managing your Medications? N  Managing your Finances? N  Housekeeping or managing your Housekeeping? N    Patient Care Team: Doreene Nest, NP as PCP - General (Internal Medicine)  Indicate any recent Medical Services  you may have received from other than [facility] providers in the past year (date may be approximate).     Assessment:   This is a routine wellness examination for [place].  Hearing/Vision screen Hearing Screening - Comments:: Pt says her hearing is good Vision Screening - Comments:: Pt says she wears glasses with driving  [employer]   Goals Addressed             This Visit's Progress    Patient Stated       09/09/23-I will maintain and continue medications as prescribed.      Patient Stated       09/09/23-Would like to eat healthier and lose weight     Patient Stated       09/09/23-Lose 15-20lbs.       Depression Screen     09/09/2023    3:07 PM 09/04/2023   10:11 AM 09/05/2022   11:05 AM 09/04/2022   11:26 AM 09/01/2021   12:00 PM 08/31/2020   11:57 AM 08/26/2019    2:03 PM  PHQ 2/9 Scores  PHQ - 2 Score 0 0 0 0 0 0 0  PHQ- 9 Score      0 0    Fall Risk     09/09/2023    3:04 PM 09/04/2023   10:11 AM 09/05/2022   11:05 AM 09/04/2022   11:30 AM 09/01/2021   11:59 AM  Fall Risk   Falls in the past year? 0 0 0 0 0  Number falls in past yr: 0 0 0 0 0  Injury with Fall? 0 0 0 0 0  Risk for  fall due to : No Fall Risks No Fall Risks No Fall Risks No Fall Risks No Fall Risks  Follow up Education provided;Falls prevention discussed Falls evaluation completed Falls evaluation completed Falls prevention discussed;Falls evaluation completed Falls prevention discussed    MEDICARE RISK AT HOME:  Medicare Risk at Home Any stairs in or around the home?: No (2 steps) If so, are there any without handrails?: No Home free of loose throw rugs in walkways, pet beds, electrical cords, etc?: Yes Adequate lighting in your home to reduce risk of falls?: Yes Life alert?: No Use of a cane, walker or w/c?: No Grab bars in the bathroom?: Yes Shower chair or bench in shower?: No Elevated toilet seat or a handicapped toilet?: No  TIMED UP AND GO:  Was the test performed?  No  Cognitive Function: 6CIT completed    08/31/2020   11:58 AM 08/26/2019    2:04 PM  MMSE - Mini Mental State Exam  Orientation to time 5 5  Orientation to Place 5 5  Registration 3 3  Attention/ Calculation 5 5  Recall 3 3  Language- repeat 1 1        09/09/2023    3:13 PM 09/04/2022   11:31 AM  6CIT Screen  What Year? 0 points   What month? 0 points   What time? 0 points 0 points  Count back from 20 0 points 0 points  Months in reverse 0 points 0 points  Repeat phrase 0 points 2 points  Total Score 0 points     Immunizations Immunization History  Administered Date(s) Administered   PFIZER(Purple Top)SARS-COV-2 Vaccination 05/06/2020, 05/22/2020    Screening Tests Health Maintenance  Topic Date Due   Pneumonia Vaccine 60+ Years old (1 of 2 - PCV) Never done   INFLUENZA VACCINE  10/07/2023 (Originally 02/07/2023)   Zoster Vaccines-  Shingrix (1 of 2) 12/02/2023 (Originally 04/13/2001)   MAMMOGRAM  09/03/2024 (Originally 04/13/2001)   DEXA SCAN  09/03/2024 (Originally 04/13/2016)   Colonoscopy  09/03/2024 (Originally 04/13/1996)   Medicare Annual Wellness (AWV)  09/08/2024   Hepatitis C Screening  Completed    HPV VACCINES  Aged Out   DTaP/Tdap/Td  Discontinued   COVID-19 Vaccine  Discontinued    Health Maintenance  Health Maintenance Due  Topic Date Due   Pneumonia Vaccine 72+ Years old (1 of 2 - PCV) Never done   Health Maintenance Items Addressed:none needed   Additional Screening:  Vision Screening: Recommended annual ophthalmology exams for early detection of glaucoma and other disorders of the eye.  Dental Screening: Recommended annual dental exams for proper oral hygiene  Community Resource Referral / Chronic Care Management: CRR required this visit?  No   CCM required this visit?  No    Plan:     I have personally reviewed and noted the following in the patient's chart:   Medical and social history Use of alcohol, tobacco or illicit drugs  Current medications and supplements including opioid prescriptions. Patient is not currently taking opioid prescriptions. Functional ability and status Nutritional status Physical activity Advanced directives List of other physicians Hospitalizations, surgeries, and ER visits in previous 12 months Vitals Screenings to include cognitive, depression, and falls Referrals and appointments  In addition, I have reviewed and discussed with patient certain preventive protocols, quality metrics, and best practice recommendations. A written personalized care plan for preventive services as well as general preventive health recommendations were provided to patient.    Sue Lush, LPN   11/11/2128   After Visit Summary: (MyChart) Due to this being a telephonic visit, the after visit summary with patients personalized plan was offered to patient via MyChart   Notes: Nothing significant to report at this time.  Vaccinations: declines all: Influenza vaccine: recommend every Fall Pneumococcal vaccine: recommend once per lifetime Prevnar-20 Tdap vaccine: recommend every 10 years Shingles vaccine: recommend Shingrix which is 2 doses  2-6 months apart and over 90% effective     Covid-19: recommend 2 doses one month apart with a booster 6 months later

## 2023-09-10 ENCOUNTER — Other Ambulatory Visit: Payer: Self-pay | Admitting: Primary Care

## 2023-09-10 DIAGNOSIS — J4520 Mild intermittent asthma, uncomplicated: Secondary | ICD-10-CM

## 2023-09-10 DIAGNOSIS — J439 Emphysema, unspecified: Secondary | ICD-10-CM

## 2023-09-16 NOTE — Telephone Encounter (Signed)
 That is unfortunate, but we cannot force her to do anything. I still recommend the Holter monitor, CT, and echo. Hopefully, she will reconsider.

## 2023-09-16 NOTE — Telephone Encounter (Signed)
 Called and spoke with patient she states she is sending the holter monitor back, she did not wear it she states the thought of it gives her anxiety. Pt says she has never had any heart problems and doesn't think the monitor is warranted. Advised patient the monitor was for her exertional SOB she stated she was aware and that her SOB is not bad it only comes on for a minute or so then resolves and has been ongoing for years. Patient is still declining Echo and CT scan at this time as well. Patient will reach out of symptoms worsen or she changes her mind.

## 2023-10-04 ENCOUNTER — Other Ambulatory Visit: Payer: PPO

## 2023-10-04 ENCOUNTER — Other Ambulatory Visit: Payer: Self-pay | Admitting: Primary Care

## 2023-10-04 DIAGNOSIS — R7989 Other specified abnormal findings of blood chemistry: Secondary | ICD-10-CM

## 2023-10-04 DIAGNOSIS — N1832 Chronic kidney disease, stage 3b: Secondary | ICD-10-CM | POA: Diagnosis not present

## 2023-10-04 LAB — BASIC METABOLIC PANEL WITH GFR
BUN: 12 mg/dL (ref 6–23)
CO2: 25 meq/L (ref 19–32)
Calcium: 9.6 mg/dL (ref 8.4–10.5)
Chloride: 100 meq/L (ref 96–112)
Creatinine, Ser: 1.37 mg/dL — ABNORMAL HIGH (ref 0.40–1.20)
GFR: 38.59 mL/min — ABNORMAL LOW (ref >60.00)
Glucose, Bld: 113 mg/dL — ABNORMAL HIGH (ref 70–99)
Potassium: 4.4 meq/L (ref 3.5–5.1)
Sodium: 134 meq/L — ABNORMAL LOW (ref 135–145)

## 2023-10-04 LAB — TSH: TSH: 0.33 u[IU]/mL — ABNORMAL LOW (ref 0.35–5.50)

## 2023-10-07 ENCOUNTER — Other Ambulatory Visit: Payer: Self-pay | Admitting: Primary Care

## 2023-10-07 ENCOUNTER — Other Ambulatory Visit

## 2023-10-07 DIAGNOSIS — R7989 Other specified abnormal findings of blood chemistry: Secondary | ICD-10-CM | POA: Diagnosis not present

## 2023-10-07 DIAGNOSIS — R946 Abnormal results of thyroid function studies: Secondary | ICD-10-CM | POA: Diagnosis not present

## 2023-10-07 DIAGNOSIS — N1832 Chronic kidney disease, stage 3b: Secondary | ICD-10-CM

## 2023-10-08 LAB — T4, FREE: Free T4: 1.2 ng/dL (ref 0.8–1.8)

## 2023-10-26 ENCOUNTER — Other Ambulatory Visit: Payer: Self-pay | Admitting: Primary Care

## 2023-10-26 DIAGNOSIS — I10 Essential (primary) hypertension: Secondary | ICD-10-CM

## 2023-10-28 ENCOUNTER — Ambulatory Visit
Admission: RE | Admit: 2023-10-28 | Discharge: 2023-10-28 | Disposition: A | Discharge status: Home / Self Care | Source: Ambulatory Visit | Attending: Primary Care

## 2023-10-28 DIAGNOSIS — N189 Chronic kidney disease, unspecified: Secondary | ICD-10-CM | POA: Diagnosis not present

## 2023-10-28 DIAGNOSIS — N1832 Chronic kidney disease, stage 3b: Secondary | ICD-10-CM

## 2023-11-02 ENCOUNTER — Other Ambulatory Visit: Payer: Self-pay | Admitting: Primary Care

## 2023-11-02 DIAGNOSIS — I10 Essential (primary) hypertension: Secondary | ICD-10-CM

## 2024-06-01 ENCOUNTER — Other Ambulatory Visit: Payer: Self-pay | Admitting: Primary Care

## 2024-06-01 DIAGNOSIS — I10 Essential (primary) hypertension: Secondary | ICD-10-CM

## 2024-06-01 NOTE — Telephone Encounter (Unsigned)
 Copied from CRM [id number]. Topic: Clinical - Medication Refill >> Jun 01, 2024  2:02 PM Victoria A wrote: Medication: hydrALAZINE  (APRESOLINE ) 50 MG tablet  Has the patient contacted their pharmacy? Yes (Agent: If no, request that the patient contact the pharmacy for the refill. If patient does not wish to contact the pharmacy document the reason why and proceed with request.) (Agent: If yes, when and what did the pharmacy advise?) No refills  This is the patient's preferred pharmacy:  [id number] [place], [place] - [address] [place] [address] Phone: [phone] Fax: [phone]  Is this the correct pharmacy for this prescription? Yes If no, delete pharmacy and type the correct one.   Has the prescription been filled recently? No  Is the patient out of the medication? Yes  Has the patient been seen for an appointment in the last year OR does the patient have an upcoming appointment? Yes  Can we respond through MyChart? Yes  Agent: Please be advised that Rx refills may take up to 3 business days. We ask that you follow-up with your pharmacy.

## 2024-07-17 ENCOUNTER — Other Ambulatory Visit: Payer: Self-pay | Admitting: Primary Care

## 2024-07-17 DIAGNOSIS — I10 Essential (primary) hypertension: Secondary | ICD-10-CM

## 2024-07-17 NOTE — Telephone Encounter (Signed)
 Patient is due for CPE/follow up in early March, this will be required prior to any further refills.  Please schedule, thank you!

## 2024-07-24 ENCOUNTER — Other Ambulatory Visit: Payer: Self-pay | Admitting: Primary Care

## 2024-07-24 DIAGNOSIS — I10 Essential (primary) hypertension: Secondary | ICD-10-CM

## 2024-07-24 NOTE — Telephone Encounter (Signed)
 Patient is due for CPE/follow up in early March, this will be required prior to any further refills.  Please schedule, thank you!

## 2024-09-09 ENCOUNTER — Ambulatory Visit
